# Patient Record
Sex: Male | Born: 1945
Health system: Southern US, Community
[De-identification: ages and names within clinical notes are randomized; demographics above are authoritative.]

## PROBLEM LIST (undated history)

## (undated) DIAGNOSIS — Z973 Presence of spectacles and contact lenses: Secondary | ICD-10-CM

## (undated) DIAGNOSIS — Z972 Presence of dental prosthetic device (complete) (partial): Secondary | ICD-10-CM

## (undated) DIAGNOSIS — G4730 Sleep apnea, unspecified: Secondary | ICD-10-CM

## (undated) DIAGNOSIS — I4891 Unspecified atrial fibrillation: Secondary | ICD-10-CM

## (undated) DIAGNOSIS — I10 Essential (primary) hypertension: Secondary | ICD-10-CM

## (undated) DIAGNOSIS — J189 Pneumonia, unspecified organism: Secondary | ICD-10-CM

## (undated) HISTORY — PX: COLONOSCOPY: SHX174

## (undated) HISTORY — DX: Sleep apnea, unspecified: G47.30

## (undated) HISTORY — DX: Essential (primary) hypertension: I10

## (undated) HISTORY — PX: KNEE SURGERY: SHX244

## (undated) HISTORY — PX: OTHER SURGICAL HISTORY: SHX169

## (undated) HISTORY — PX: FOOT SURGERY: SHX648

## (undated) HISTORY — DX: Unspecified atrial fibrillation: I48.91

## (undated) HISTORY — PX: HERNIA REPAIR: SHX51

---

## 2012-06-17 DIAGNOSIS — G4733 Obstructive sleep apnea (adult) (pediatric): Secondary | ICD-10-CM | POA: Insufficient documentation

## 2014-04-16 DIAGNOSIS — G4730 Sleep apnea, unspecified: Secondary | ICD-10-CM

## 2014-04-16 DIAGNOSIS — I10 Essential (primary) hypertension: Secondary | ICD-10-CM

## 2014-04-16 DIAGNOSIS — I4891 Unspecified atrial fibrillation: Secondary | ICD-10-CM

## 2014-04-16 HISTORY — DX: Sleep apnea, unspecified: G47.30

## 2014-04-16 HISTORY — DX: Essential (primary) hypertension: I10

## 2014-04-16 HISTORY — DX: Unspecified atrial fibrillation: I48.91

## 2014-08-13 DIAGNOSIS — R21 Rash and other nonspecific skin eruption: Secondary | ICD-10-CM | POA: Insufficient documentation

## 2014-08-14 DIAGNOSIS — E785 Hyperlipidemia, unspecified: Secondary | ICD-10-CM | POA: Insufficient documentation

## 2016-01-24 DIAGNOSIS — I10 Essential (primary) hypertension: Secondary | ICD-10-CM | POA: Insufficient documentation

## 2017-04-10 ENCOUNTER — Encounter: Payer: Self-pay | Admitting: *Deleted

## 2017-04-12 ENCOUNTER — Encounter: Payer: Self-pay | Admitting: Nurse Practitioner

## 2017-04-12 ENCOUNTER — Ambulatory Visit: Payer: Medicare PPO | Admitting: Nurse Practitioner

## 2017-04-12 VITALS — BP 118/86 | HR 106 | Temp 97.9°F | Ht 67.0 in | Wt 253.0 lb

## 2017-04-12 DIAGNOSIS — R5383 Other fatigue: Secondary | ICD-10-CM

## 2017-04-12 DIAGNOSIS — Z8669 Personal history of other diseases of the nervous system and sense organs: Secondary | ICD-10-CM | POA: Diagnosis not present

## 2017-04-12 DIAGNOSIS — I4891 Unspecified atrial fibrillation: Secondary | ICD-10-CM | POA: Diagnosis not present

## 2017-04-12 DIAGNOSIS — Z6841 Body Mass Index (BMI) 40.0 and over, adult: Secondary | ICD-10-CM

## 2017-04-12 MED ORDER — ASPIRIN EC 325 MG PO TBEC: 325.0000 mg | DELAYED_RELEASE_TABLET | Freq: Every day | ORAL | 0 refills | Status: DC

## 2017-04-12 NOTE — Patient Instructions (Addendum)
Atrial Fibrillation Atrial fibrillation is a type of irregular or rapid heartbeat (arrhythmia). In atrial fibrillation, the heart quivers continuously in a chaotic pattern. This occurs when parts of the heart receive disorganized signals that make the heart unable to pump blood normally. This can increase the risk for stroke, heart failure, and other heart-related conditions. There are different types of atrial fibrillation, including:  Paroxysmal atrial fibrillation. This type starts suddenly, and it usually stops on its own shortly after it starts.  Persistent atrial fibrillation. This type often lasts longer than a week. It may stop on its own or with treatment.  Long-lasting persistent atrial fibrillation. This type lasts longer than 12 months.  Permanent atrial fibrillation. This type does not go away.  Talk with your health care provider to learn about the type of atrial fibrillation that you have. What are the causes? This condition is caused by some heart-related conditions or procedures, including:  A heart attack.  Coronary artery disease.  Heart failure.  Heart valve conditions.  High blood pressure.  Inflammation of the sac that surrounds the heart (pericarditis).  Heart surgery.  Certain heart rhythm disorders, such as Wolf-Parkinson-White syndrome.  Other causes include:  Pneumonia.  Obstructive sleep apnea.  Blockage of an artery in the lungs (pulmonary embolism, or PE).  Lung cancer.  Chronic lung disease.  Thyroid problems, especially if the thyroid is overactive (hyperthyroidism).  Caffeine.  Excessive alcohol use or illegal drug use.  Use of some medicines, including certain decongestants and diet pills.  Sometimes, the cause cannot be found. What increases the risk? This condition is more likely to develop in:  People who are older in age.  People who smoke.  People who have diabetes mellitus.  People who are overweight  (obese).  Athletes who exercise vigorously.  What are the signs or symptoms? Symptoms of this condition include:  A feeling that your heart is beating rapidly or irregularly.  A feeling of discomfort or pain in your chest.  Shortness of breath.  Sudden light-headedness or weakness.  Getting tired easily during exercise.  In some cases, there are no symptoms. How is this diagnosed? Your health care provider may be able to detect atrial fibrillation when taking your pulse. If detected, this condition may be diagnosed with:  An electrocardiogram (ECG).  A Holter monitor test that records your heartbeat patterns over a 24-hour period.  Transthoracic echocardiogram (TTE) to evaluate how blood flows through your heart.  Transesophageal echocardiogram (TEE) to view more detailed images of your heart.  A stress test.  Imaging tests, such as a CT scan or chest X-ray.  Blood tests.  How is this treated? The main goals of treatment are to prevent blood clots from forming and to keep your heart beating at a normal rate and rhythm. The type of treatment that you receive depends on many factors, such as your underlying medical conditions and how you feel when you are experiencing atrial fibrillation. This condition may be treated with:  Medicine to slow down the heart rate, bring the heart's rhythm back to normal, or prevent clots from forming.  Electrical cardioversion. This is a procedure that resets your heart's rhythm by delivering a controlled, low-energy shock to the heart through your skin.  Different types of ablation, such as catheter ablation, catheter ablation with pacemaker, or surgical ablation. These procedures destroy the heart tissues that send abnormal signals. When the pacemaker is used, it is placed under your skin to help your heart beat in   a regular rhythm.  Follow these instructions at home:  Take over-the counter and prescription medicines only as told by your  health care provider.  If your health care provider prescribed a blood-thinning medicine (anticoagulant), take it exactly as told. Taking too much blood-thinning medicine can cause bleeding. If you do not take enough blood-thinning medicine, you will not have the protection that you need against stroke and other problems.  Do not use tobacco products, including cigarettes, chewing tobacco, and e-cigarettes. If you need help quitting, ask your health care provider.  If you have obstructive sleep apnea, manage your condition as told by your health care provider.  Do not drink alcohol.  Do not drink beverages that contain caffeine, such as coffee, soda, and tea.  Maintain a healthy weight. Do not use diet pills unless your health care provider approves. Diet pills may make heart problems worse.  Follow diet instructions as told by your health care provider.  Exercise regularly as told by your health care provider.  Keep all follow-up visits as told by your health care provider. This is important. How is this prevented?  Avoid drinking beverages that contain caffeine or alcohol.  Avoid certain medicines, especially medicines that are used for breathing problems.  Avoid certain herbs and herbal medicines, such as those that contain ephedra or ginseng.  Do not use illegal drugs, such as cocaine and amphetamines.  Do not smoke.  Manage your high blood pressure. Contact a health care provider if:  You notice a change in the rate, rhythm, or strength of your heartbeat.  You are taking an anticoagulant and you notice increased bruising.  You tire more easily when you exercise or exert yourself. Get help right away if:  You have chest pain, abdominal pain, sweating, or weakness.  You feel nauseous.  You notice blood in your vomit, bowel movement, or urine.  You have shortness of breath.  You suddenly have swollen feet and ankles.  You feel dizzy.  You have sudden weakness or  numbness of the face, arm, or leg, especially on one side of the body.  You have trouble speaking, trouble understanding, or both (aphasia).  Your face or your eyelid droops on one side. These symptoms may represent a serious problem that is an emergency. Do not wait to see if the symptoms will go away. Get medical help right away. Call your local emergency services (911 in the [place] Do not drive yourself to the hospital. This information is not intended to replace advice given to you by your health care provider. Make sure you discuss any questions you have with your health care provider. Document Released: 04/02/2005 Document Revised: 08/10/2015 Document Reviewed: 07/28/2014 Elsevier Interactive Patient Education  2018 Elsevier Inc.  

## 2017-04-12 NOTE — Progress Notes (Signed)
Subjective:  Patient ID: Ricky Dorsey, male    DOB: 1946/04/08  Age: 71 y.o. MRN: [id number]  CC: Establish Care (est care/ weight loss problem and vision problem)   HPI  No pcp in last 17yrs.  Retired. Moved to [place] from [place]. 36yrs ago.  Exercise 2-3times a week: treadmil and weights.  Diet:mostly vegetable, home cooked meals, fresh, organic.  Reports Hx of Sleep apnea:  Non restorative sleep: Interrupted every 40min, unable to sleep for more than 2hrs. Diagnosed with severe sleep apnea several years ago:  Has not used CPAP machine in 87yrs. Unable to tolerate different masks Sleeps in recliner. He is concerned about persistent weight gain and fatigue despite regular exercise and heart healthy diet.  Outpatient Medications Prior to Visit  Medication Sig Dispense Refill  . Multiple Vitamins-Minerals (ONE DAILY MENS 50+ MULTIVIT PO)      No facility-administered medications prior to visit.    Social History   Socioeconomic History  . Marital status: Married    Spouse name: None  . Number of children: None  . Years of education: None  . Highest education level: None  Social Needs  . Financial resource strain: None  . Food insecurity - worry: None  . Food insecurity - inability: None  . Transportation needs - medical: None  . Transportation needs - non-medical: None  Occupational History  . None  Tobacco Use  . Smoking status: Former Research scientist (life sciences)  . Smokeless tobacco: Never Used  . Tobacco comment: stop smoke 30years  Substance and Sexual Activity  . Alcohol use: No    Frequency: Never  . Drug use: No  . Sexual activity: None  Other Topics Concern  . None  Social History Narrative  . None   Family History  Problem Relation Age of Onset  . Diabetes Mother   . Heart disease Father   . Heart disease Brother   . Cancer Brother        lung cancer   ROS Review of Systems  Constitutional: Positive for malaise/fatigue. Negative for weight loss.  HENT: Positive for  congestion and hearing loss.   Respiratory: Negative.   Cardiovascular: Negative for chest pain, palpitations, orthopnea and leg swelling.  Gastrointestinal: Negative.   Genitourinary: Negative.   Musculoskeletal: Negative.   Skin: Negative.   Neurological: Negative.   Psychiatric/Behavioral: Negative.     Objective:  BP 118/86 (BP Location: Left Arm, Patient Position: Sitting, Cuff Size: Large)   Pulse (!) 106   Temp 97.9 F (36.6 C) (Oral)   Ht 5\' 7"  (1.702 m)   Wt 253 lb (114.8 kg)   SpO2 96%   BMI 39.63 kg/m   BP Readings from Last 3 Encounters:  04/12/17 118/86    Wt Readings from Last 3 Encounters:  04/12/17 253 lb (114.8 kg)   Physical Exam  Constitutional: He is oriented to person, place, and time. No distress.  Neck: Normal range of motion. Neck supple. No thyromegaly present.  Cardiovascular: Normal rate, normal heart sounds and intact distal pulses. An irregularly irregular rhythm present.  No murmur heard. Pulmonary/Chest: Effort normal and breath sounds normal.  Lymphadenopathy:    He has no cervical adenopathy.  Neurological: He is alert and oriented to person, place, and time.  Skin: Skin is warm and dry.  Psychiatric: He has a normal mood and affect. His behavior is normal.  Vitals reviewed.   Lab Results  Component Value Date   WBC 7.8 04/12/2017   HGB 17.6 (H) 04/12/2017  HCT 50.0 04/12/2017   PLT 271 04/12/2017   GLUCOSE 90 04/12/2017   CHOL 177 04/12/2017   TRIG 125 04/12/2017   HDL 36 (L) 04/12/2017   ALT 22 04/12/2017   AST 20 04/12/2017   NA 142 04/12/2017   K 5.1 04/12/2017   CL 103 04/12/2017   CREATININE 0.84 04/12/2017   BUN 17 04/12/2017   CO2 28 04/12/2017   TSH 2.32 04/12/2017   HGBA1C 5.3 04/12/2017   ECG indicates Atrial fibrillation, RBBB, and possible LVH  Assessment & Plan:   Rochelle was seen today for establish care.  Diagnoses and all orders for this visit:  Atrial fibrillation, unspecified type ([place]) -      EKG 12-Lead -     Ambulatory referral to Cardiology -     ECHOCARDIOGRAM COMPLETE; Future -     aspirin EC 325 MG tablet; Take 1 tablet (325 mg total) by mouth daily. -     Magnesium  Class 3 severe obesity with body mass index (BMI) of 40.0 to 44.9 in adult, unspecified obesity type, unspecified whether serious comorbidity present (HCC) -     TSH -     Comprehensive metabolic panel -     CBC w/Diff -     Hemoglobin A1c -     Lipid panel -     VITAMIN D 25 Hydroxy (Vit-D Deficiency, Fractures) -     Testosterone  Fatigue, unspecified type -     TSH -     Comprehensive metabolic panel -     CBC w/Diff -     Hemoglobin A1c -     Lipid panel -     VITAMIN D 25 Hydroxy (Vit-D Deficiency, Fractures) -     Testosterone  Hx of sleep apnea   I am having Kasten Caples start on aspirin EC. I am also having him maintain his Multiple Vitamins-Minerals (ONE DAILY MENS 50+ MULTIVIT PO).  Meds ordered this encounter  Medications  . aspirin EC 325 MG tablet    Sig: Take 1 tablet (325 mg total) by mouth daily.    Dispense:  30 tablet    Refill:  0    Order Specific Question:   Supervising Provider    Answer:   Lucille Passy [[id number]]    Follow-up: Return in about 4 weeks (around 05/10/2017) for fatigue and a-fib.  Wilfred Lacy, NP

## 2017-04-13 LAB — COMPREHENSIVE METABOLIC PANEL
AG Ratio: 1.6 (calc) (ref 1.0–2.5)
ALT: 22 U/L (ref 9–46)
AST: 20 U/L (ref 10–35)
Albumin: 4.1 g/dL (ref 3.6–5.1)
Alkaline phosphatase (APISO): 58 U/L (ref 40–115)
BILIRUBIN TOTAL: 0.6 mg/dL (ref 0.2–1.2)
BUN: 17 mg/dL (ref 7–25)
CALCIUM: 9.7 mg/dL (ref 8.6–10.3)
CO2: 28 mmol/L (ref 20–32)
CREATININE: 0.84 mg/dL (ref 0.70–1.25)
Chloride: 103 mmol/L (ref 98–110)
Globulin: 2.5 g/dL (calc) (ref 1.9–3.7)
Glucose, Bld: 90 mg/dL (ref 65–99)
Potassium: 5.1 mmol/L (ref 3.5–5.3)
SODIUM: 142 mmol/L (ref 135–146)
TOTAL PROTEIN: 6.6 g/dL (ref 6.1–8.1)

## 2017-04-13 LAB — CBC WITH DIFFERENTIAL/PLATELET
BASOS ABS: 62 {cells}/uL (ref 0–200)
Basophils Relative: 0.8 %
EOS PCT: 3.6 %
Eosinophils Absolute: 281 cells/uL (ref 15–500)
HEMATOCRIT: 50 % (ref 38.5–50.0)
HEMOGLOBIN: 17.6 g/dL — AB (ref 13.2–17.1)
LYMPHS ABS: 1700 {cells}/uL (ref 850–3900)
MCH: 32 pg (ref 27.0–33.0)
MCHC: 35.2 g/dL (ref 32.0–36.0)
MCV: 90.9 fL (ref 80.0–100.0)
MPV: 9.8 fL (ref 7.5–12.5)
Monocytes Relative: 10.5 %
NEUTROS ABS: 4937 {cells}/uL (ref 1500–7800)
Neutrophils Relative %: 63.3 %
Platelets: 271 10*3/uL (ref 140–400)
RBC: 5.5 10*6/uL (ref 4.20–5.80)
RDW: 12.7 % (ref 11.0–15.0)
Total Lymphocyte: 21.8 %
WBC: 7.8 10*3/uL (ref 3.8–10.8)
WBCMIX: 819 {cells}/uL (ref 200–950)

## 2017-04-13 LAB — HEMOGLOBIN A1C
EAG (MMOL/L): 5.8 (calc)
Hgb A1c MFr Bld: 5.3 % of total Hgb (ref <5.7)
Mean Plasma Glucose: 105 (calc)

## 2017-04-13 LAB — LIPID PANEL
CHOL/HDL RATIO: 4.9 (calc) (ref <5.0)
Cholesterol: 177 mg/dL (ref <200)
HDL: 36 mg/dL — AB (ref >40)
LDL CHOLESTEROL (CALC): 117 mg/dL — AB
NON-HDL CHOLESTEROL (CALC): 141 mg/dL — AB (ref <130)
TRIGLYCERIDES: 125 mg/dL (ref <150)

## 2017-04-13 LAB — MAGNESIUM: Magnesium: 2.2 mg/dL (ref 1.5–2.5)

## 2017-04-13 LAB — TESTOSTERONE: Testosterone: 344 ng/dL (ref 250–827)

## 2017-04-13 LAB — VITAMIN D 25 HYDROXY (VIT D DEFICIENCY, FRACTURES): Vit D, 25-Hydroxy: 30 ng/mL (ref 30–100)

## 2017-04-13 LAB — TSH: TSH: 2.32 m[IU]/L (ref 0.40–4.50)

## 2017-04-15 ENCOUNTER — Encounter: Payer: Self-pay | Admitting: Nurse Practitioner

## 2017-04-15 DIAGNOSIS — I4891 Unspecified atrial fibrillation: Secondary | ICD-10-CM | POA: Insufficient documentation

## 2017-04-15 DIAGNOSIS — Z8669 Personal history of other diseases of the nervous system and sense organs: Secondary | ICD-10-CM | POA: Insufficient documentation

## 2017-04-15 DIAGNOSIS — Z6841 Body Mass Index (BMI) 40.0 and over, adult: Secondary | ICD-10-CM

## 2017-04-15 NOTE — Assessment & Plan Note (Signed)
Undetermined time of onset. CHADs2 score of 0. Start ASA. Echocardiogram ordered Referral to cardiology entered.

## 2017-04-15 NOTE — Assessment & Plan Note (Signed)
No CPAP use at this time.

## 2017-04-19 ENCOUNTER — Other Ambulatory Visit: Payer: Self-pay

## 2017-04-19 ENCOUNTER — Ambulatory Visit (HOSPITAL_COMMUNITY): Payer: Medicare PPO | Attending: Cardiovascular Disease

## 2017-04-19 DIAGNOSIS — I420 Dilated cardiomyopathy: Secondary | ICD-10-CM | POA: Insufficient documentation

## 2017-04-19 DIAGNOSIS — I4891 Unspecified atrial fibrillation: Secondary | ICD-10-CM

## 2017-04-19 NOTE — Progress Notes (Unsigned)
Patient did not give consent to the use of Definity at this time. He stated that he will come back if additional images are needed.

## 2017-04-22 ENCOUNTER — Telehealth: Payer: Self-pay | Admitting: Nurse Practitioner

## 2017-04-22 NOTE — Telephone Encounter (Signed)
Copied from [place]. Topic: General - Other >> Apr 22, 2017  3:22 PM Lolita Rieger, [place] wrote: Reason for CRM: pt would like a call back concerning the dosage of his aspirin

## 2017-04-22 NOTE — Telephone Encounter (Signed)
Ricky Dorsey spoke with the pt, he verbalized understand--advise pt Okto take Aspirin 81 mg until he see the cardiologist.

## 2017-05-06 ENCOUNTER — Telehealth: Payer: Self-pay | Admitting: Nurse Practitioner

## 2017-05-06 NOTE — Telephone Encounter (Signed)
Spoke with Mr. Administrator, arts regarding AWV. Pt declined to schedule appt.   Pt stated that he has several other appointments coming up and does not want to add another one at this time. SF

## 2017-05-16 ENCOUNTER — Encounter: Payer: Self-pay | Admitting: Internal Medicine

## 2017-05-17 ENCOUNTER — Ambulatory Visit: Payer: Medicare PPO | Admitting: Nurse Practitioner

## 2017-05-17 ENCOUNTER — Encounter: Payer: Medicare PPO | Admitting: Nurse Practitioner

## 2017-05-24 ENCOUNTER — Ambulatory Visit: Payer: Medicare PPO | Admitting: Internal Medicine

## 2017-06-06 ENCOUNTER — Encounter: Payer: Self-pay | Admitting: Internal Medicine

## 2017-09-17 ENCOUNTER — Emergency Department (HOSPITAL_COMMUNITY)
Admission: EM | Admit: 2017-09-17 | Discharge: 2017-09-17 | Disposition: A | Discharge status: Home / Self Care | Payer: Medicare PPO | Attending: Emergency Medicine | Admitting: Emergency Medicine

## 2017-09-17 ENCOUNTER — Emergency Department (HOSPITAL_COMMUNITY): Payer: Medicare PPO

## 2017-09-17 DIAGNOSIS — Z7982 Long term (current) use of aspirin: Secondary | ICD-10-CM | POA: Diagnosis not present

## 2017-09-17 DIAGNOSIS — L03115 Cellulitis of right lower limb: Secondary | ICD-10-CM | POA: Insufficient documentation

## 2017-09-17 DIAGNOSIS — R509 Fever, unspecified: Secondary | ICD-10-CM | POA: Insufficient documentation

## 2017-09-17 DIAGNOSIS — I480 Paroxysmal atrial fibrillation: Secondary | ICD-10-CM | POA: Diagnosis not present

## 2017-09-17 DIAGNOSIS — Z87891 Personal history of nicotine dependence: Secondary | ICD-10-CM | POA: Insufficient documentation

## 2017-09-17 DIAGNOSIS — Z79899 Other long term (current) drug therapy: Secondary | ICD-10-CM | POA: Insufficient documentation

## 2017-09-17 DIAGNOSIS — R531 Weakness: Secondary | ICD-10-CM | POA: Diagnosis present

## 2017-09-17 DIAGNOSIS — I10 Essential (primary) hypertension: Secondary | ICD-10-CM | POA: Insufficient documentation

## 2017-09-17 DIAGNOSIS — I4891 Unspecified atrial fibrillation: Secondary | ICD-10-CM

## 2017-09-17 LAB — BASIC METABOLIC PANEL
Anion gap: 9 (ref 5–15)
BUN: 16 mg/dL (ref 6–20)
CALCIUM: 8.7 mg/dL — AB (ref 8.9–10.3)
CHLORIDE: 102 mmol/L (ref 101–111)
CO2: 22 mmol/L (ref 22–32)
Creatinine, Ser: 0.87 mg/dL (ref 0.61–1.24)
GFR calc non Af Amer: >60 mL/min (ref >60)
Glucose, Bld: 116 mg/dL — ABNORMAL HIGH (ref 65–99)
Potassium: 4.4 mmol/L (ref 3.5–5.1)
SODIUM: 133 mmol/L — AB (ref 135–145)

## 2017-09-17 LAB — CBC
HCT: 46.3 % (ref 39.0–52.0)
Hemoglobin: 15.9 g/dL (ref 13.0–17.0)
MCH: 31.5 pg (ref 26.0–34.0)
MCHC: 34.3 g/dL (ref 30.0–36.0)
MCV: 91.7 fL (ref 78.0–100.0)
PLATELETS: 178 10*3/uL (ref 150–400)
RBC: 5.05 MIL/uL (ref 4.22–5.81)
RDW: 13.4 % (ref 11.5–15.5)
WBC: 7.7 10*3/uL (ref 4.0–10.5)

## 2017-09-17 LAB — I-STAT CG4 LACTIC ACID, ED: LACTIC ACID, VENOUS: 1.27 mmol/L (ref 0.5–1.9)

## 2017-09-17 LAB — I-STAT TROPONIN, ED: Troponin i, poc: 0.02 ng/mL (ref 0.00–0.08)

## 2017-09-17 MED ORDER — DOXYCYCLINE HYCLATE 100 MG PO TABS
100.0000 mg | ORAL_TABLET | Freq: Once | ORAL | Status: AC
  Administered 2017-09-17: 100 mg via ORAL
  Filled 2017-09-17: qty 1

## 2017-09-17 MED ORDER — SODIUM CHLORIDE 0.9 % IV BOLUS
1000.0000 mL | Freq: Once | INTRAVENOUS | Status: AC
  Administered 2017-09-17: 1000 mL via INTRAVENOUS

## 2017-09-17 MED ORDER — DOXYCYCLINE HYCLATE 100 MG PO CAPS: 100.0000 mg | ORAL_CAPSULE | Freq: Two times a day (BID) | ORAL | 0 refills | Status: DC

## 2017-09-17 MED ORDER — ACETAMINOPHEN 325 MG PO TABS
650.0000 mg | ORAL_TABLET | Freq: Once | ORAL | Status: AC
  Administered 2017-09-17: 650 mg via ORAL
  Filled 2017-09-17: qty 2

## 2017-09-17 MED ORDER — DILTIAZEM HCL 30 MG PO TABS
30.0000 mg | ORAL_TABLET | Freq: Once | ORAL | Status: AC
  Administered 2017-09-17: 30 mg via ORAL
  Filled 2017-09-17: qty 1

## 2017-09-17 NOTE — ED Notes (Signed)
Patient transported to X-ray 

## 2017-09-17 NOTE — ED Triage Notes (Signed)
Pt states after working outside yesterday he has felt weak with fatigue and having a headache. Pt was sent here from urgent care due to afib pt has paroxsymal afib. Current rate 115's.

## 2017-09-17 NOTE — Discharge Instructions (Addendum)
Continue Aspirin Take Doxycycline twice daily for the next 5 days Please follow up with A. Fib clinic. They should give you a call but if you don't hear from them, their number [phone]

## 2017-09-17 NOTE — ED Provider Notes (Signed)
[facility] EMERGENCY DEPARTMENT Provider Note   CSN: [id number] Arrival date & time: 09/17/17  1303     History   Chief Complaint Chief Complaint  Patient presents with  . Weakness    HPI Ricky Dorsey is a 72 y.o. male who presents with weakness. PMH significant for hx of A.fib, sleep apnea, HTN. He states that yesterday he was working outside for ~4 hours. When he came inside he felt weak, tired, had a headache and was thirsty so he rested and drank fluids. Early in the morning he felt like his stomach was cramping so he forced himself to throw up which made him feel a little better for a couple hours. Then he started to feel weak and unwell again. He decided to go to UC who did an EKG and it showed he was in A.fib with RVR. He is from [facility] and his A.fib was previously attributed to his OSA. He has been off any anticoagulation or treatment for his A.fib since 2011. He denies prior cardiac history. He denies URI symptoms, cough, congestion, chest pain, SOB, abdominal pain, N/V/D, urinary symptoms. He has had a recent surgery on one of the tendons on his left 2nd toe about three weeks ago by podiatry. It has been red and a little painful as well. Echo in Jan 2019 showed EF 60-65%  HPI  Past Medical History:  Diagnosis Date  . Atrial fibrillation ([place]) 2016  . Hypertension 2016  . Sleep apnea 2016    Patient Active Problem List   Diagnosis Date Noted  . Atrial fibrillation ([place]) 04/15/2017  . Class 3 severe obesity with body mass index (BMI) of 40.0 to 44.9 in adult [facility]) 04/15/2017  . Hx of sleep apnea 04/15/2017    Past Surgical History:  Procedure Laterality Date  . KNEE SURGERY Bilateral   . toe removal Right    tip of middle toe      Home Medications    Prior to Admission medications   Medication Sig Start Date End Date Taking? Authorizing Provider  aspirin EC 325 MG tablet Take 1 tablet (325 mg total) by mouth daily. 04/12/17  Yes Nche, Charlene Brooke, NP  ibuprofen (ADVIL,MOTRIN) 200 MG tablet Take 800 mg by mouth every 6 (six) hours as needed (for pain or headaches).   Yes [provider]  Magnesium 500 MG TABS Take 500 mg by mouth daily.   Yes [provider]  Multiple Vitamins-Minerals (ONE DAILY MENS 50+ MULTIVIT PO) Take 1 tablet by mouth daily.  01/14/17  Yes [provider]  NON FORMULARY Apply 1 application topically See admin instructions. "Soothing Blend" Essential Oil(s): Apply topically as needed to provide comfort   Yes [provider]    Family History Family History  Problem Relation Age of Onset  . Diabetes Mother   . Heart disease Father   . Heart disease Brother   . Cancer Brother        lung cancer    Social History Social History   Tobacco Use  . Smoking status: Former Research scientist (life sciences)  . Smokeless tobacco: Never Used  . Tobacco comment: stop smoke 30years  Substance Use Topics  . Alcohol use: No    Frequency: Never  . Drug use: No     Allergies   Patient has no known allergies.   Review of Systems Review of Systems  Constitutional: Positive for activity change, fatigue and fever.  HENT: Negative for congestion and sore throat.  Respiratory: Negative for cough and shortness of breath.   Cardiovascular: Negative for chest pain, palpitations and leg swelling.  Gastrointestinal: Negative for abdominal pain, diarrhea, nausea and vomiting.  Genitourinary: Negative for dysuria and frequency.  Musculoskeletal: Positive for arthralgias.  Skin: Positive for color change.  Neurological: Positive for weakness.  All other systems reviewed and are negative.    Physical Exam Updated Vital Signs BP 104/71   Pulse (!) 108   Temp (!) 101.2 F (38.4 C) (Oral)   Resp 18   SpO2 96%   Physical Exam  Constitutional: He is oriented to person, place, and time. He appears well-developed and well-nourished. No distress.  HENT:  Head: Normocephalic and atraumatic.  Right Ear:  Tympanic membrane normal.  Left Ear: Tympanic membrane normal.  Nose: Nose normal.  Mouth/Throat: Uvula is midline, oropharynx is clear and moist and mucous membranes are normal.  Eyes: Pupils are equal, round, and reactive to light. Conjunctivae are normal. Right eye exhibits no discharge. Left eye exhibits no discharge. No scleral icterus.  Neck: Normal range of motion.  Cardiovascular: An irregularly irregular rhythm present. Tachycardia present.  Pulmonary/Chest: Breath sounds normal. Tachypnea (mildly) noted. No respiratory distress.  Abdominal: Soft. Bowel sounds are normal. He exhibits no distension. There is no tenderness.  Musculoskeletal:  Erythema noted over the left foot from the toes to mid-foot. Healing blister over the tip of the great toe. Callus formation over the plantar aspect of the 2nd toe.  Neurological: He is alert and oriented to person, place, and time.  Skin: Skin is warm and dry.  Psychiatric: He has a normal mood and affect. His behavior is normal.  Nursing note and vitals reviewed.    ED Treatments / Results  Labs (all labs ordered are listed, but only abnormal results are displayed) Labs Reviewed  BASIC METABOLIC PANEL - Abnormal; Notable for the following components:      Result Value   Sodium 133 (*)    Glucose, Bld 116 (*)    Calcium 8.7 (*)    All other components within normal limits  CBC  I-STAT CG4 LACTIC ACID, ED  I-STAT TROPONIN, ED    EKG EKG Interpretation  Date/Time:  Tuesday September 17 2017 13:10:33 EDT Ventricular Rate:  121 PR Interval:    QRS Duration: 98 QT Interval:  314 QTC Calculation: 445 R Axis:   121 Text Interpretation:  Atrial fibrillation with rapid ventricular response Right axis deviation Incomplete right bundle branch block Possible Right ventricular hypertrophy Abnormal ECG No STEMI. No prior tracings for comparisons.  Confirmed by Nanda Quinton [phone]) on 09/17/2017 5:05:32 PM   Radiology Dg Chest 2 View  Result  Date: 09/17/2017 CLINICAL DATA:  Paroxysmal atrial fibrillation. EXAM: CHEST - 2 VIEW COMPARISON:  None. FINDINGS: Cardiomediastinal silhouette is mildly enlarged. Calcific atherosclerotic disease and tortuosity of the aorta. Mediastinal contours appear intact. There is no evidence of focal airspace consolidation, pleural effusion or pneumothorax. Osseous structures are without acute abnormality. Soft tissues are grossly normal. IMPRESSION: No active cardiopulmonary disease. Calcific atherosclerotic disease and tortuosity of the aorta. Mildly enlarged cardiac silhouette. Electronically Signed   By: Fidela Salisbury M.D.   On: 09/17/2017 18:16   Dg Foot Complete Right  Result Date: 09/17/2017 CLINICAL DATA:  Swelling redness and pain to the first and second right toes. EXAM: RIGHT FOOT COMPLETE - 3+ VIEW COMPARISON:  None. FINDINGS: There is no evidence of fracture or dislocation. Arthritic changes at the first metatarsophalangeal joint and first interphalangeal joint.  Soft tissues are unremarkable. IMPRESSION: No evidence of acute osseous abnormalities. Arthritic changes at the first metatarsophalangeal and interphalangeal joints. Electronically Signed   By: Fidela Salisbury M.D.   On: 09/17/2017 18:18    Procedures Procedures (including critical care time)  Medications Ordered in ED Medications  sodium chloride 0.9 % bolus 1,000 mL (has no administration in time range)  acetaminophen (TYLENOL) tablet 650 mg (650 mg Oral Given 09/17/17 1549)  diltiazem (CARDIZEM) tablet 30 mg (30 mg Oral Given 09/17/17 1703)  doxycycline (VIBRA-TABS) tablet 100 mg (100 mg Oral Given 09/17/17 1702)     Initial Impression / Assessment and Plan / ED Course  I have reviewed the triage vital signs and the nursing notes.  Pertinent labs & imaging results that were available during my care of the patient were reviewed by me and considered in my medical decision making (see chart for details).  72 year old male  presents with weakness, fever, and evidence of a foot infection on exam. He is also noted to be in A.fib with RVR. He is febrile to 101.2. He is tachycardic as high as 120 but is generally ranging from 100-110. His work up thus far is very reassuring. His CBC, BMP, lactic acid are normal. Troponin is normal. He will be given IVF, Tylenol, low dose of Diltiazem, and Doxy and reassessed. CXR and R foot xray were ordered as well.  On recheck he is feeling better. HR is normal although it is still irregular. CXR shows aortic atherosclerosis and mild cardiomegaly. R foot xray is remarkable for arthritis. Shared visit with Dr. Laverta Baltimore. We will continue Doxy and advised f/u with A.fib clinic.   Final Clinical Impressions(s) / ED Diagnoses   Final diagnoses:  Fever, unspecified fever cause  Cellulitis of right foot  Atrial fibrillation with RVR [facility])    ED Discharge Orders    None       Recardo Evangelist, PA-C 09/17/17 2010    Margette Fast, MD 09/18/17 [phone]

## 2017-09-17 NOTE — ED Notes (Signed)
Pt ambulated to bathroom while in xray and in the room, able to ambulate without assistance.

## 2017-10-01 ENCOUNTER — Ambulatory Visit (HOSPITAL_COMMUNITY)
Admission: RE | Admit: 2017-10-01 | Discharge: 2017-10-01 | Disposition: A | Discharge status: Home / Self Care | Payer: Medicare PPO | Source: Ambulatory Visit | Attending: Nurse Practitioner | Admitting: Nurse Practitioner

## 2017-10-01 ENCOUNTER — Encounter (HOSPITAL_COMMUNITY): Payer: Self-pay | Admitting: Nurse Practitioner

## 2017-10-01 VITALS — BP 136/82 | HR 90 | Ht 67.0 in | Wt 250.0 lb

## 2017-10-01 DIAGNOSIS — I482 Chronic atrial fibrillation: Secondary | ICD-10-CM | POA: Diagnosis present

## 2017-10-01 DIAGNOSIS — G4730 Sleep apnea, unspecified: Secondary | ICD-10-CM | POA: Diagnosis not present

## 2017-10-01 DIAGNOSIS — Z87891 Personal history of nicotine dependence: Secondary | ICD-10-CM | POA: Insufficient documentation

## 2017-10-01 DIAGNOSIS — I4821 Permanent atrial fibrillation: Secondary | ICD-10-CM

## 2017-10-01 DIAGNOSIS — Z6839 Body mass index (BMI) 39.0-39.9, adult: Secondary | ICD-10-CM | POA: Insufficient documentation

## 2017-10-01 DIAGNOSIS — Z7982 Long term (current) use of aspirin: Secondary | ICD-10-CM | POA: Insufficient documentation

## 2017-10-01 DIAGNOSIS — Z801 Family history of malignant neoplasm of trachea, bronchus and lung: Secondary | ICD-10-CM | POA: Diagnosis not present

## 2017-10-01 DIAGNOSIS — I10 Essential (primary) hypertension: Secondary | ICD-10-CM | POA: Insufficient documentation

## 2017-10-01 NOTE — Progress Notes (Signed)
Primary Care Physician: Lorayne Marek Charlene Brooke, NP Referring Physician: ER   Ricky Dorsey is a 72 y.o. male with a history of permanent atrial fibrillation who presents for consultation in the [facility].  The patient was initially diagnosed with atrial fibrillation in 2011 while living in [place]. His AF was found incidentally at that time and he was without symptoms. He was placed on Xarelto with cardioversion in 2014 which did not maintain SR. Because of low CHADS2VASC score, OAC was discontinued.  He recently moved to [facility] and has been referred to the AF clinic to evaluate treatment options. He has struggled with weight despite regular exercise and good diet.  He is concerned about his inability to lose weight.   Today, he denies symptoms of palpitations, chest pain, shortness of breath, orthopnea, PND, lower extremity edema, dizziness, presyncope, syncope, snoring, daytime somnolence, bleeding, or neurologic sequela. The patient is tolerating medications without difficulties and is otherwise without complaint today.    Atrial Fibrillation Risk Factors:  he does have symptoms or diagnosis of sleep apnea. he is compliant with CPAP therapy.  he does not have a history of rheumatic fever.  he does not have a history of alcohol use.  he has a BMI of Body mass index is 39.16 kg/m.Marland Kitchen Filed Weights   10/01/17 0938  Weight: 250 lb (113.4 kg)    LA size: 46   Atrial Fibrillation Management history:  Previous antiarrhythmic drugs: none  Previous cardioversions: 2014  Previous ablations: none  CHADS2VASC score: 1  Anticoagulation history: Xarelto for a short period around DCCV   Past Medical History:  Diagnosis Date  . Atrial fibrillation ([place]) 2016  . Hypertension 2016  . Sleep apnea 2016   Past Surgical History:  Procedure Laterality Date  . KNEE SURGERY Bilateral   . toe removal Right    tip of middle toe    Current Outpatient Medications    Medication Sig Dispense Refill  . aspirin EC 325 MG tablet Take 1 tablet (325 mg total) by mouth daily. 30 tablet 0  . ibuprofen (ADVIL,MOTRIN) 200 MG tablet Take 800 mg by mouth every 6 (six) hours as needed (for pain or headaches).    . Magnesium 500 MG TABS Take 500 mg by mouth daily.    . Multiple Vitamins-Minerals (ONE DAILY MENS 50+ MULTIVIT PO) Take 1 tablet by mouth daily.     . NON FORMULARY Apply 1 application topically See admin instructions. "Soothing Blend" Essential Oil(s): Apply topically as needed to provide comfort     No current facility-administered medications for this encounter.     No Known Allergies  Social History   Socioeconomic History  . Marital status: Married    Spouse name: Not on file  . Number of children: Not on file  . Years of education: Not on file  . Highest education level: Not on file  Occupational History  . Not on file  Social Needs  . Financial resource strain: Not on file  . Food insecurity:    Worry: Not on file    Inability: Not on file  . Transportation needs:    Medical: Not on file    Non-medical: Not on file  Tobacco Use  . Smoking status: Former Research scientist (life sciences)  . Smokeless tobacco: Never Used  . Tobacco comment: stop smoke 30years  Substance and Sexual Activity  . Alcohol use: No    Frequency: Never  . Drug use: No  . Sexual activity: Not  on file  Lifestyle  . Physical activity:    Days per week: Not on file    Minutes per session: Not on file  . Stress: Not on file  Relationships  . Social connections:    Talks on phone: Not on file    Gets together: Not on file    Attends religious service: Not on file    Active member of club or organization: Not on file    Attends meetings of clubs or organizations: Not on file    Relationship status: Not on file  . Intimate partner violence:    Fear of current or ex partner: Not on file    Emotionally abused: Not on file    Physically abused: Not on file    Forced sexual  activity: Not on file  Other Topics Concern  . Not on file  Social History Narrative  . Not on file    Family History  Problem Relation Age of Onset  . Diabetes Mother   . Heart disease Father   . Heart disease Brother   . Cancer Brother        lung cancer   The patient does not have a history of early familial atrial fibrillation or other arrhythmias.  ROS- All systems are reviewed and negative except as per the HPI above.  Physical Exam: Vitals:   10/01/17 0938  BP: 136/82  Pulse: 90  Weight: 250 lb (113.4 kg)  Height: 5\' 7"  (1.702 m)    GEN- The patient is well appearing, alert and oriented x 3 today.   Head- normocephalic, atraumatic Eyes-  Sclera clear, conjunctiva pink Ears- hearing intact Oropharynx- clear Neck- supple  Lungs- Clear to ausculation bilaterally, normal work of breathing Heart- Regular rate and rhythm, no murmurs, rubs or gallops  GI- soft, NT, ND, + BS Extremities- no clubbing, cyanosis, or edema MS- no significant deformity or atrophy Skin- no rash or lesion Psych- euthymic mood, full affect Neuro- strength and sensation are intact  Wt Readings from Last 3 Encounters:  10/01/17 250 lb (113.4 kg)  04/12/17 253 lb (114.8 kg)    EKG today demonstrates atrial fibrillation, rate 90, iRBBB, QRS 155msec, QTc 494msec Echo 04/2017 demonstrated EF 60-65%, no RWMA, LA enlargement  Epic records are reviewed at length today  Assessment and Plan:  1. Permanent atrial fibrillation The patient has asymptomatic permanent atrial fibrillation.   CHADS2VASC is 1.  We discussed options today including no OAC, ASA, or OAC based on guidelines.  He will continue ASA 81mg  daily.  As he is asymptomatic and with duration of AF, I would not pursue rhythm control strategy.   2. Morbid obesity Body mass index is 39.16 kg/m. He is frustrated by his inability to lose weight. I have given him the number to Dennard Nip, MD to reach out to.   Follow up with AF  clinic as needed   Chanetta Marshall, NP 10/02/2017 9:28 PM

## 2017-10-01 NOTE — Patient Instructions (Signed)
Janeal Holmes weight management- [phone]

## 2018-02-18 DIAGNOSIS — M2041 Other hammer toe(s) (acquired), right foot: Secondary | ICD-10-CM | POA: Diagnosis not present

## 2018-02-21 DIAGNOSIS — M12271 Villonodular synovitis (pigmented), right ankle and foot: Secondary | ICD-10-CM | POA: Diagnosis not present

## 2018-03-05 DIAGNOSIS — M12271 Villonodular synovitis (pigmented), right ankle and foot: Secondary | ICD-10-CM | POA: Diagnosis not present

## 2018-03-19 DIAGNOSIS — Z79899 Other long term (current) drug therapy: Secondary | ICD-10-CM | POA: Diagnosis not present

## 2018-03-19 DIAGNOSIS — M2041 Other hammer toe(s) (acquired), right foot: Secondary | ICD-10-CM | POA: Diagnosis not present

## 2018-03-19 DIAGNOSIS — M12271 Villonodular synovitis (pigmented), right ankle and foot: Secondary | ICD-10-CM | POA: Diagnosis not present

## 2018-03-19 DIAGNOSIS — L03031 Cellulitis of right toe: Secondary | ICD-10-CM | POA: Diagnosis not present

## 2018-03-20 DIAGNOSIS — R609 Edema, unspecified: Secondary | ICD-10-CM | POA: Diagnosis not present

## 2018-03-26 DIAGNOSIS — M25774 Osteophyte, right foot: Secondary | ICD-10-CM | POA: Diagnosis not present

## 2018-03-26 DIAGNOSIS — L97522 Non-pressure chronic ulcer of other part of left foot with fat layer exposed: Secondary | ICD-10-CM | POA: Diagnosis not present

## 2018-04-03 DIAGNOSIS — L97511 Non-pressure chronic ulcer of other part of right foot limited to breakdown of skin: Secondary | ICD-10-CM | POA: Diagnosis not present

## 2018-04-29 DIAGNOSIS — J22 Unspecified acute lower respiratory infection: Secondary | ICD-10-CM | POA: Diagnosis not present

## 2018-05-01 DIAGNOSIS — J22 Unspecified acute lower respiratory infection: Secondary | ICD-10-CM | POA: Diagnosis not present

## 2018-05-06 DIAGNOSIS — J189 Pneumonia, unspecified organism: Secondary | ICD-10-CM | POA: Diagnosis not present

## 2018-05-06 DIAGNOSIS — I878 Other specified disorders of veins: Secondary | ICD-10-CM | POA: Diagnosis not present

## 2018-05-07 DIAGNOSIS — M2041 Other hammer toe(s) (acquired), right foot: Secondary | ICD-10-CM | POA: Diagnosis not present

## 2018-05-07 DIAGNOSIS — L97512 Non-pressure chronic ulcer of other part of right foot with fat layer exposed: Secondary | ICD-10-CM | POA: Diagnosis not present

## 2018-05-07 DIAGNOSIS — T8189XA Other complications of procedures, not elsewhere classified, initial encounter: Secondary | ICD-10-CM | POA: Diagnosis not present

## 2018-05-15 DIAGNOSIS — L97512 Non-pressure chronic ulcer of other part of right foot with fat layer exposed: Secondary | ICD-10-CM | POA: Diagnosis not present

## 2018-05-17 DIAGNOSIS — J189 Pneumonia, unspecified organism: Secondary | ICD-10-CM

## 2018-05-17 HISTORY — DX: Pneumonia, unspecified organism: J18.9

## 2018-06-05 DIAGNOSIS — M79671 Pain in right foot: Secondary | ICD-10-CM | POA: Diagnosis not present

## 2018-06-05 DIAGNOSIS — Z0001 Encounter for general adult medical examination with abnormal findings: Secondary | ICD-10-CM | POA: Diagnosis not present

## 2018-06-05 DIAGNOSIS — I878 Other specified disorders of veins: Secondary | ICD-10-CM | POA: Diagnosis not present

## 2018-06-11 ENCOUNTER — Ambulatory Visit (INDEPENDENT_AMBULATORY_CARE_PROVIDER_SITE_OTHER): Payer: Self-pay | Admitting: Orthopedic Surgery

## 2018-06-12 ENCOUNTER — Ambulatory Visit (INDEPENDENT_AMBULATORY_CARE_PROVIDER_SITE_OTHER): Payer: Medicare PPO | Admitting: Orthopedic Surgery

## 2018-06-12 ENCOUNTER — Ambulatory Visit (INDEPENDENT_AMBULATORY_CARE_PROVIDER_SITE_OTHER): Payer: Medicare PPO

## 2018-06-12 ENCOUNTER — Encounter (INDEPENDENT_AMBULATORY_CARE_PROVIDER_SITE_OTHER): Payer: Self-pay | Admitting: Orthopedic Surgery

## 2018-06-12 VITALS — Ht 67.0 in | Wt 250.0 lb

## 2018-06-12 DIAGNOSIS — I87321 Chronic venous hypertension (idiopathic) with inflammation of right lower extremity: Secondary | ICD-10-CM | POA: Diagnosis not present

## 2018-06-12 DIAGNOSIS — M6701 Short Achilles tendon (acquired), right ankle: Secondary | ICD-10-CM

## 2018-06-12 DIAGNOSIS — M79671 Pain in right foot: Secondary | ICD-10-CM

## 2018-06-16 ENCOUNTER — Encounter (INDEPENDENT_AMBULATORY_CARE_PROVIDER_SITE_OTHER): Payer: Self-pay | Admitting: Orthopedic Surgery

## 2018-06-16 ENCOUNTER — Telehealth (INDEPENDENT_AMBULATORY_CARE_PROVIDER_SITE_OTHER): Payer: Self-pay | Admitting: Orthopedic Surgery

## 2018-06-16 NOTE — Progress Notes (Signed)
Office Visit Note   Patient: Ricky Dorsey           Date of Birth: 1945-08-24           MRN: [id number] Visit Date: 06/12/2018              Requested by: Flossie Buffy, [address] [place], [place] [address] PCP: Flossie Buffy, NP  Chief Complaint  Patient presents with  . Right Foot - Pain      HPI: Patient is a 73 year old gentleman who was seen for initial evaluation for right forefoot pain.  Patient complains of redness and swelling of the second toe on fourth toe.  Patient states he has been followed by podiatry.  Patient denies a history of diabetes is not using blood thinners.  Assessment & Plan: Visit Diagnoses:  1. Right foot pain   2. Pain in right foot   3. Idiopathic chronic venous hypertension of right lower extremity with inflammation   4. Achilles tendon contracture, right     Plan: Patient does have Achilles contracture with venous insufficiency and is overloading his forefoot.  Recommended 15 to 20 mm compression stockings that patient will go to [facility] to get size extra-large.  Recommended Achilles stretching and this was demonstrated.  Recommended a stiff soled sneaker to unload the forefoot such as Hoka sneakers with over-the-counter orthotics to get more pressure beneath the arch.  Follow-Up Instructions: Return in about 2 weeks (around 06/26/2018).   Ortho Exam  Patient is alert, oriented, no adenopathy, well-dressed, normal affect, normal respiratory effort.  Conference 43 cm and brawny skin color changes.  He is status post nail excisions for toes 1 2 and 3.  He has fixed clawing of the lesser toes with a ulcer over the tip of the fourth toe from the claw toe deformity.  There is no exposed bone or tendon the ulcer is superficial. On examination patient has a strong dorsalis pedis pulse.  With his knee extended he only has dorsiflexion to neutral.  He has venous stasis insufficiency with a capsular  Imaging: No results  found. No images are attached to the encounter.  Labs: Lab Results  Component Value Date   HGBA1C 5.3 04/12/2017     No results found for: ALBUMIN, PREALBUMIN, LABURIC  Body mass index is 39.16 kg/m.  Orders:  Orders Placed This Encounter  Procedures  . XR Foot Complete Right   No orders of the defined types were placed in this encounter.    Procedures: No procedures performed  Clinical Data: No additional findings.  ROS:  All other systems negative, except as noted in the HPI. Review of Systems  Objective: Vital Signs: Ht 5\' 7"  (1.702 m)   Wt 250 lb (113.4 kg)   BMI 39.16 kg/m   Specialty Comments:  No specialty comments available.  PMFS History: Patient Active Problem List   Diagnosis Date Noted  . Atrial fibrillation ([place]) 04/15/2017  . Class 3 severe obesity with body mass index (BMI) of 40.0 to 44.9 in adult [facility]) 04/15/2017  . Hx of sleep apnea 04/15/2017   Past Medical History:  Diagnosis Date  . Atrial fibrillation ([place]) 2016  . Hypertension 2016  . Sleep apnea 2016    Family History  Problem Relation Age of Onset  . Diabetes Mother   . Heart disease Father   . Heart disease Brother   . Cancer Brother        lung cancer  Past Surgical History:  Procedure Laterality Date  . KNEE SURGERY Bilateral   . toe removal Right    tip of middle toe   Social History   Occupational History  . Not on file  Tobacco Use  . Smoking status: Former Research scientist (life sciences)  . Smokeless tobacco: Never Used  . Tobacco comment: stop smoke 30years  Substance and Sexual Activity  . Alcohol use: No    Frequency: Never  . Drug use: No  . Sexual activity: Not on file

## 2018-06-16 NOTE — Telephone Encounter (Signed)
Filled out rx for [facility] medical. Vive socks XL knee high 15-20 compression. Called pt to advise that this is ready for pick up at the front desk. He will come in tomorrow.

## 2018-06-16 NOTE — Telephone Encounter (Signed)
Noted  

## 2018-06-16 NOTE — Telephone Encounter (Signed)
Patient was given script for socks and misplaced it. Wants to pick up another one.  Please call patient to advise. [phone]

## 2018-06-25 ENCOUNTER — Ambulatory Visit (INDEPENDENT_AMBULATORY_CARE_PROVIDER_SITE_OTHER): Payer: Medicare PPO | Admitting: Orthopedic Surgery

## 2018-06-30 ENCOUNTER — Ambulatory Visit (INDEPENDENT_AMBULATORY_CARE_PROVIDER_SITE_OTHER): Payer: Medicare PPO | Admitting: Orthopedic Surgery

## 2018-07-10 ENCOUNTER — Other Ambulatory Visit: Payer: Self-pay

## 2018-07-10 ENCOUNTER — Ambulatory Visit (INDEPENDENT_AMBULATORY_CARE_PROVIDER_SITE_OTHER): Payer: Medicare PPO

## 2018-07-10 ENCOUNTER — Encounter (INDEPENDENT_AMBULATORY_CARE_PROVIDER_SITE_OTHER): Payer: Self-pay | Admitting: Orthopedic Surgery

## 2018-07-10 ENCOUNTER — Ambulatory Visit (INDEPENDENT_AMBULATORY_CARE_PROVIDER_SITE_OTHER): Payer: Medicare PPO | Admitting: Orthopedic Surgery

## 2018-07-10 VITALS — Ht 67.0 in | Wt 250.0 lb

## 2018-07-10 DIAGNOSIS — L03031 Cellulitis of right toe: Secondary | ICD-10-CM | POA: Diagnosis not present

## 2018-07-10 DIAGNOSIS — M86471 Chronic osteomyelitis with draining sinus, right ankle and foot: Secondary | ICD-10-CM | POA: Diagnosis not present

## 2018-07-10 DIAGNOSIS — M869 Osteomyelitis, unspecified: Secondary | ICD-10-CM | POA: Diagnosis not present

## 2018-07-10 MED ORDER — DOXYCYCLINE HYCLATE 100 MG PO TABS: 100.0000 mg | ORAL_TABLET | Freq: Two times a day (BID) | ORAL | 0 refills | Status: DC

## 2018-07-10 NOTE — Progress Notes (Signed)
Office Visit Note   Patient: Ricky Dorsey           Date of Birth: [birth year]           MRN: [id number] Visit Date: 07/10/2018              Requested by: Flossie Buffy, NP [address] [place], [place] [address] PCP: Flossie Buffy, NP  Chief Complaint  Patient presents with   Right Foot - Pain      HPI: Patient is a 73 year old gentleman who presents with acute cellulitis right foot fourth toe.  He has been wearing the Vive knee-high compression stockings he has been wearing Hoka sneakers with the sole orthotics.  Patient states the callus continues to recur on the fourth toe and he has had cellulitis 3 times.  Patient is not a smoker no history of diabetes no history of hypertension.  Assessment & Plan: Visit Diagnoses: No diagnosis found.  Plan: Discussed with the patient with the swelling cellulitis and osteomyelitis of the tuft of the fourth toe would recommend amputation of the fourth toe through the PIP joint.  And with the chronic osteomyelitis of the tuft of the second toe would recommend amputating the second toe through the middle phalanx.  Discussed that he would be out of work for about 2 weeks.  A prescription for doxycycline is called in today patient states he would like to talk with his with his wife he will call us and anticipate surgery Wednesday or Friday next week.  Follow-Up Instructions: No follow-ups on file.   Ortho Exam  Patient is alert, oriented, no adenopathy, well-dressed, normal affect, normal respiratory effort. Examination patient has a strong dorsalis pedis and posterior tibial pulse.  He does have venous stasis changes but no venous ulcers.  There is acute cellulitis of the right fourth toe with sausage digit swelling.  After informed consent a 10 blade knife was used to debride the skin and soft tissue from the ulcer of the tip of the toe this left a wound that was 7 mm in diameter 1 mm deep.  There was good bleeding no  purulent drainage.  Patient has some chronic swelling of the tip of the second toe.  No cellulitis or swelling in any other toes.  The radiograph shows chronic osteomyelitis of the tuft of the second and fourth toes.  Imaging: No results found. No images are attached to the encounter.  Labs: Lab Results  Component Value Date   HGBA1C 5.3 04/12/2017     No results found for: ALBUMIN, PREALBUMIN, LABURIC  Body mass index is 39.16 kg/m.  Orders:  No orders of the defined types were placed in this encounter.  No orders of the defined types were placed in this encounter.    Procedures: No procedures performed  Clinical Data: No additional findings.  ROS:  All other systems negative, except as noted in the HPI. Review of Systems  Objective: Vital Signs: Ht 5\' 7"  (1.702 m)    Wt 250 lb (113.4 kg)    BMI 39.16 kg/m   Specialty Comments:  No specialty comments available.  PMFS History: Patient Active Problem List   Diagnosis Date Noted   Atrial fibrillation ([place]) 04/15/2017   Class 3 severe obesity with body mass index (BMI) of 40.0 to 44.9 in adult [facility]) 04/15/2017   Hx of sleep apnea 04/15/2017   Past Medical History:  Diagnosis Date   Atrial fibrillation ([place]) 2016   Hypertension 2016  Sleep apnea 2016    Family History  Problem Relation Age of Onset   Diabetes Mother    Heart disease Father    Heart disease Brother    Cancer Brother        lung cancer    Past Surgical History:  Procedure Laterality Date   KNEE SURGERY Bilateral    toe removal Right    tip of middle toe   Social History   Occupational History   Not on file  Tobacco Use   Smoking status: Former Smoker   Smokeless tobacco: Never Used   Tobacco comment: stop smoke 30years  Substance and Sexual Activity   Alcohol use: No    Frequency: Never   Drug use: No   Sexual activity: Not on file

## 2018-07-14 ENCOUNTER — Ambulatory Visit (INDEPENDENT_AMBULATORY_CARE_PROVIDER_SITE_OTHER): Payer: Self-pay | Admitting: Physician Assistant

## 2018-07-15 ENCOUNTER — Encounter (HOSPITAL_COMMUNITY): Payer: Self-pay | Admitting: *Deleted

## 2018-07-15 ENCOUNTER — Other Ambulatory Visit: Payer: Self-pay

## 2018-07-15 ENCOUNTER — Other Ambulatory Visit (INDEPENDENT_AMBULATORY_CARE_PROVIDER_SITE_OTHER): Payer: Self-pay | Admitting: Orthopedic Surgery

## 2018-07-15 NOTE — Progress Notes (Signed)
Anesthesia Chart Review: SAME DAY WORK-UP   Case:  [id number] Date/Time:  07/16/18 0815   Procedure:  AMPUTATION DIP (DISTAL INTERPHALANGEAL) JOINT 2ND AND 4TH TOE RIGHT FOOT (Right )   Anesthesia type:  Choice   Pre-op diagnosis:  Osteomyelitis Right 4th and 2nd Toe   Location:  [facility] OR ROOM 01 / [facility]   Surgeon:  Newt Minion, MD      DISCUSSION: Patient is a 72 year old male scheduled for the above procedure.  History includes chronic afib (diagnosed 2011, [place]; recurrent afib following DCCV 2014), OSA (no using CPAP), obesity, former smoker, HTN, right third toe partial amputation.   Patient on ASA for chronic afib. Rate was controlled and patient asymptomatic from his afib, so discharged from the [facility] after 09/2017 visit with PRN follow-up recommended.   Patient is a same day work-up, so he will get vitals, labs, and evaluation from his anesthesia team prior to planned surgery. Definitive plan at that time.     VS: Ht 5\' 7"  (1.702 m)   Wt 114.3 kg   BMI 39.47 kg/m     PROVIDERS: Nche, Charlene Brooke, NP is listed as PCP. PCP documented by our PAT phone RN as Horald Pollen, MD. - Seen at [facility] on 10/01/17 by Chanetta Marshall, NP. According to note, "CHADS2VASC is 1.  We discussed options today including no OAC, ASA, or OAC based on guidelines.  He will continue ASA 81mg  daily.  As he is asymptomatic and with duration of AF, I would not pursue rhythm control strategy." PRN Afib Clinic follow-up recommended.    LABS: He is for labs on the day of surgery. Cr and H/H normal in 09/2017.   IMAGES: CXR 09/17/17: IMPRESSION: No active cardiopulmonary disease. Calcific atherosclerotic disease and tortuosity of the aorta. Mildly enlarged cardiac silhouette.   EKG: 10/01/17: Atrial fibrillation Rightward axis Low voltage QRS Incomplete right bundle branch block Abnormal ECG Confirmed by Larae Grooms [phone]) on 10/01/2017 6:18:03 PM   CV: RLE venous  US 03/20/18 ([place]): IMPRESSION: No evidence of deep venous thrombosis in the right lower extremity.  Echo 04/19/17: Study Conclusions - Left ventricle: The cavity size was normal. Systolic function was   normal. The estimated ejection fraction was in the range of 60%   to 65%. Although no diagnostic regional wall motion abnormality   was identified, this possibility cannot be completely excluded on   the basis of this study. - Mitral valve: Calcified annulus. - Left atrium: The atrium was moderately dilated.   Past Medical History:  Diagnosis Date  . Atrial fibrillation ([place]) 2016  . Hypertension 2016   no longer an issue per patient 07/15/2018  . Pneumonia 05/2018  . Sleep apnea 2016   Does not use CPAP  . Wears glasses    reading  . Wears partial dentures    upper    Past Surgical History:  Procedure Laterality Date  . COLONOSCOPY     Normal  . FOOT SURGERY Right    x 3 minor procedures  . HERNIA REPAIR    . KNEE SURGERY Bilateral   . toe removal Right    tip of middle toe    MEDICATIONS: No current facility-administered medications for this encounter.    Marland Kitchen aspirin EC 81 MG tablet  . doxycycline (VIBRA-TABS) 100 MG tablet  . ibuprofen (ADVIL,MOTRIN) 200 MG tablet  . Magnesium 500 MG TABS  . Multiple Vitamin (MULTIVITAMIN WITH MINERALS) TABS  tablet  . NON FORMULARY    Myra Gianotti, PA-C Surgical Short Stay/Anesthesiology [facility] Phone [phone] [facility] Phone [phone] 07/15/2018 12:37 PM

## 2018-07-15 NOTE — Progress Notes (Signed)
Spoke with patient regarding pre-op instrucutions for DOS.  Patient denies SOB, Chest pain, Fever, Cough, denies respiratory issues.  Patient was instructed to stop now all vitamins, supplements, Ibuprofen, NSAIDS, Goody's, BC powder and fish oil.  May take tylenol if needed.  Dr Sharol Given did not instruct patient to stop aspirin.  Last dose of aspirin 07/15/2018.    PCP - Dr Horald Pollen Cardiologist - Denies, Saw Roderic Palau, NP @ [facility] in 09/2017  Chest x-ray - 09/17/17 EKG - 10/01/17 Stress Test - denies ECHO - 04/19/17 Cardiac Cath - denies  Sleep Study - Done years ago [place] CPAP - Does not use CPAP   Coronavirus Screening  Have you or your wife Verdis Frederickson experienced the following symptoms:  Cough yes/no: No Fever (>100.32F)  yes/no: No Runny nose yes/no: No Sore throat yes/no: No Difficulty breathing/shortness of breath  yes/no: No  Have you or your wife Verdis Frederickson traveled in the last 14 days and where? yes/no: No  Patient informed of the hospital visitation restrictions that are now in place.  Patient verbalized understanding of all pre-op instructions for DOS.

## 2018-07-15 NOTE — Anesthesia Preprocedure Evaluation (Addendum)
Anesthesia Evaluation  Patient identified by MRN, date of birth, ID band Patient awake    Reviewed: Allergy & Precautions, NPO status , Patient's Chart, lab work & pertinent test results  Airway Mallampati: III  TM Distance: >3 FB     Dental  (+) Dental Advisory Given   Pulmonary sleep apnea , former smoker,    breath sounds clear to auscultation       Cardiovascular hypertension, Pt. on medications + dysrhythmias  Rhythm:Irregular Rate:Normal     Neuro/Psych negative neurological ROS     GI/Hepatic negative GI ROS, Neg liver ROS,   Endo/Other  Morbid obesity  Renal/GU negative Renal ROS     Musculoskeletal   Abdominal   Peds  Hematology negative hematology ROS (+)   Anesthesia Other Findings   Reproductive/Obstetrics                           Lab Results  Component Value Date   WBC 7.7 09/17/2017   HGB 15.9 09/17/2017   HCT 46.3 09/17/2017   MCV 91.7 09/17/2017   PLT 178 09/17/2017   Lab Results  Component Value Date   CREATININE 0.87 09/17/2017   BUN 16 09/17/2017   NA 133 (L) 09/17/2017   K 4.4 09/17/2017   CL 102 09/17/2017   CO2 22 09/17/2017   Echo 04/19/17: Study Conclusions - Left ventricle: The cavity size was normal. Systolic function was normal. The estimated ejection fraction was in the range of 60% to 65%. Although no diagnostic regional wall motion abnormality was identified, this possibility cannot be completely excluded on the basis of this study. - Mitral valve: Calcified annulus. - Left atrium: The atrium was moderately dilated. Anesthesia Physical Anesthesia Plan  ASA: III  Anesthesia Plan: General   Post-op Pain Management:    Induction: Intravenous  PONV Risk Score and Plan: 2 and Dexamethasone, Ondansetron and Treatment may vary due to age or medical condition  Airway Management Planned: Oral ETT  Additional Equipment:   Intra-op  Plan:   Post-operative Plan: Extubation in OR  Informed Consent: I have reviewed the patients History and Physical, chart, labs and discussed the procedure including the risks, benefits and alternatives for the proposed anesthesia with the patient or authorized representative who has indicated his/her understanding and acceptance.     Dental advisory given  Plan Discussed with: CRNA  Anesthesia Plan Comments: ( )      Anesthesia Quick Evaluation

## 2018-07-16 ENCOUNTER — Encounter (HOSPITAL_COMMUNITY)
Admission: RE | Disposition: A | Discharge status: Home / Self Care | Payer: Self-pay | Source: Home / Self Care | Attending: Orthopedic Surgery

## 2018-07-16 ENCOUNTER — Ambulatory Visit (HOSPITAL_COMMUNITY)
Admission: RE | Admit: 2018-07-16 | Discharge: 2018-07-16 | Disposition: A | Discharge status: Home / Self Care | Payer: Medicare PPO | Attending: Orthopedic Surgery | Admitting: Orthopedic Surgery

## 2018-07-16 ENCOUNTER — Ambulatory Visit (HOSPITAL_COMMUNITY): Payer: Medicare PPO | Admitting: Vascular Surgery

## 2018-07-16 ENCOUNTER — Encounter (HOSPITAL_COMMUNITY): Payer: Self-pay

## 2018-07-16 ENCOUNTER — Other Ambulatory Visit: Payer: Self-pay

## 2018-07-16 DIAGNOSIS — Z6839 Body mass index (BMI) 39.0-39.9, adult: Secondary | ICD-10-CM | POA: Insufficient documentation

## 2018-07-16 DIAGNOSIS — Z791 Long term (current) use of non-steroidal anti-inflammatories (NSAID): Secondary | ICD-10-CM | POA: Insufficient documentation

## 2018-07-16 DIAGNOSIS — G4730 Sleep apnea, unspecified: Secondary | ICD-10-CM | POA: Diagnosis not present

## 2018-07-16 DIAGNOSIS — M86671 Other chronic osteomyelitis, right ankle and foot: Secondary | ICD-10-CM | POA: Diagnosis not present

## 2018-07-16 DIAGNOSIS — Z87891 Personal history of nicotine dependence: Secondary | ICD-10-CM | POA: Insufficient documentation

## 2018-07-16 DIAGNOSIS — L03031 Cellulitis of right toe: Secondary | ICD-10-CM

## 2018-07-16 DIAGNOSIS — I4891 Unspecified atrial fibrillation: Secondary | ICD-10-CM | POA: Insufficient documentation

## 2018-07-16 DIAGNOSIS — L03115 Cellulitis of right lower limb: Secondary | ICD-10-CM | POA: Insufficient documentation

## 2018-07-16 DIAGNOSIS — Z7982 Long term (current) use of aspirin: Secondary | ICD-10-CM | POA: Diagnosis not present

## 2018-07-16 DIAGNOSIS — M869 Osteomyelitis, unspecified: Secondary | ICD-10-CM

## 2018-07-16 DIAGNOSIS — I10 Essential (primary) hypertension: Secondary | ICD-10-CM | POA: Diagnosis not present

## 2018-07-16 HISTORY — DX: Presence of spectacles and contact lenses: Z97.3

## 2018-07-16 HISTORY — DX: Presence of dental prosthetic device (complete) (partial): Z97.2

## 2018-07-16 HISTORY — DX: Pneumonia, unspecified organism: J18.9

## 2018-07-16 HISTORY — PX: AMPUTATION: SHX166

## 2018-07-16 LAB — COMPREHENSIVE METABOLIC PANEL
ALT: 23 U/L (ref 0–44)
AST: 23 U/L (ref 15–41)
Albumin: 3.5 g/dL (ref 3.5–5.0)
Alkaline Phosphatase: 48 U/L (ref 38–126)
Anion gap: 9 (ref 5–15)
BUN: 17 mg/dL (ref 8–23)
CO2: 23 mmol/L (ref 22–32)
Calcium: 9.1 mg/dL (ref 8.9–10.3)
Chloride: 106 mmol/L (ref 98–111)
Creatinine, Ser: 0.93 mg/dL (ref 0.61–1.24)
GFR calc Af Amer: >60 mL/min (ref >60)
GFR calc non Af Amer: >60 mL/min (ref >60)
Glucose, Bld: 110 mg/dL — ABNORMAL HIGH (ref 70–99)
Potassium: 4 mmol/L (ref 3.5–5.1)
Sodium: 138 mmol/L (ref 135–145)
Total Bilirubin: 1.1 mg/dL (ref 0.3–1.2)
Total Protein: 6.8 g/dL (ref 6.5–8.1)

## 2018-07-16 LAB — CBC
HCT: 49.1 % (ref 39.0–52.0)
Hemoglobin: 15.9 g/dL (ref 13.0–17.0)
MCH: 30.5 pg (ref 26.0–34.0)
MCHC: 32.4 g/dL (ref 30.0–36.0)
MCV: 94.1 fL (ref 80.0–100.0)
Platelets: 227 10*3/uL (ref 150–400)
RBC: 5.22 MIL/uL (ref 4.22–5.81)
RDW: 13.3 % (ref 11.5–15.5)
WBC: 6.1 10*3/uL (ref 4.0–10.5)
nRBC: 0 % (ref 0.0–0.2)

## 2018-07-16 SURGERY — AMPUTATION DIGIT
Anesthesia: General | Site: Foot | Laterality: Right

## 2018-07-16 MED ORDER — MIDAZOLAM HCL 5 MG/5ML IJ SOLN
INTRAMUSCULAR | Status: DC | PRN
  Administered 2018-07-16: 1 mg via INTRAVENOUS

## 2018-07-16 MED ORDER — LIDOCAINE 2% (20 MG/ML) 5 ML SYRINGE
INTRAMUSCULAR | Status: DC | PRN
  Administered 2018-07-16: 60 mg via INTRAVENOUS

## 2018-07-16 MED ORDER — BUPIVACAINE-EPINEPHRINE (PF) 0.5% -1:200000 IJ SOLN
INTRAMUSCULAR | Status: AC
  Filled 2018-07-16: qty 30

## 2018-07-16 MED ORDER — PROPOFOL 10 MG/ML IV BOLUS
INTRAVENOUS | Status: DC | PRN
  Administered 2018-07-16: 150 mg via INTRAVENOUS

## 2018-07-16 MED ORDER — HYDROCODONE-ACETAMINOPHEN 5-325 MG PO TABS: 1.0000 | ORAL_TABLET | ORAL | 0 refills | Status: DC | PRN

## 2018-07-16 MED ORDER — 0.9 % SODIUM CHLORIDE (POUR BTL) OPTIME
TOPICAL | Status: DC | PRN
  Administered 2018-07-16: 09:00:00 1000 mL

## 2018-07-16 MED ORDER — CEFAZOLIN SODIUM-DEXTROSE 2-4 GM/100ML-% IV SOLN
INTRAVENOUS | Status: AC
  Filled 2018-07-16: qty 100

## 2018-07-16 MED ORDER — DEXAMETHASONE SODIUM PHOSPHATE 10 MG/ML IJ SOLN
INTRAMUSCULAR | Status: DC | PRN
  Administered 2018-07-16: 5 mg via INTRAVENOUS

## 2018-07-16 MED ORDER — SUCCINYLCHOLINE CHLORIDE 20 MG/ML IJ SOLN
INTRAMUSCULAR | Status: DC | PRN
  Administered 2018-07-16: 100 mg via INTRAVENOUS

## 2018-07-16 MED ORDER — CHLORHEXIDINE GLUCONATE 4 % EX LIQD: 60.0000 mL | Freq: Once | CUTANEOUS | Status: DC

## 2018-07-16 MED ORDER — BUPIVACAINE HCL (PF) 0.5 % IJ SOLN
INTRAMUSCULAR | Status: AC
  Filled 2018-07-16: qty 30

## 2018-07-16 MED ORDER — LACTATED RINGERS IV SOLN
INTRAVENOUS | Status: DC | PRN
  Administered 2018-07-16: 08:00:00 via INTRAVENOUS

## 2018-07-16 MED ORDER — SODIUM CHLORIDE 0.9 % IV SOLN
INTRAVENOUS | Status: DC | PRN
  Administered 2018-07-16: 09:00:00 50 ug/min via INTRAVENOUS

## 2018-07-16 MED ORDER — FENTANYL CITRATE (PF) 250 MCG/5ML IJ SOLN
INTRAMUSCULAR | Status: DC | PRN
  Administered 2018-07-16: 100 ug via INTRAVENOUS

## 2018-07-16 MED ORDER — PROPOFOL 10 MG/ML IV BOLUS
INTRAVENOUS | Status: AC
  Filled 2018-07-16: qty 20

## 2018-07-16 MED ORDER — CEFAZOLIN SODIUM-DEXTROSE 2-4 GM/100ML-% IV SOLN
2.0000 g | INTRAVENOUS | Status: AC
  Administered 2018-07-16: 2 g via INTRAVENOUS

## 2018-07-16 MED ORDER — MIDAZOLAM HCL 2 MG/2ML IJ SOLN
INTRAMUSCULAR | Status: AC
  Filled 2018-07-16: qty 2

## 2018-07-16 MED ORDER — ONDANSETRON HCL 4 MG/2ML IJ SOLN
INTRAMUSCULAR | Status: DC | PRN
  Administered 2018-07-16: 4 mg via INTRAVENOUS

## 2018-07-16 MED ORDER — FENTANYL CITRATE (PF) 250 MCG/5ML IJ SOLN
INTRAMUSCULAR | Status: AC
  Filled 2018-07-16: qty 5

## 2018-07-16 MED ORDER — FENTANYL CITRATE (PF) 100 MCG/2ML IJ SOLN: 25.0000 ug | INTRAMUSCULAR | Status: DC | PRN

## 2018-07-16 MED ORDER — ONDANSETRON HCL 4 MG/2ML IJ SOLN: 4.0000 mg | Freq: Once | INTRAMUSCULAR | Status: DC | PRN

## 2018-07-16 MED ORDER — BUPIVACAINE HCL (PF) 0.5 % IJ SOLN
INTRAMUSCULAR | Status: DC | PRN
  Administered 2018-07-16: 10 mL

## 2018-07-16 SURGICAL SUPPLY — 30 items
BLADE SURG 21 STRL SS (BLADE) ×3 IMPLANT
BNDG COHESIVE 4X5 TAN STRL (GAUZE/BANDAGES/DRESSINGS) ×3 IMPLANT
BNDG GAUZE ELAST 4 BULKY (GAUZE/BANDAGES/DRESSINGS) ×3 IMPLANT
CHLORAPREP W/TINT 26 (MISCELLANEOUS) ×3 IMPLANT
COVER SURGICAL LIGHT HANDLE (MISCELLANEOUS) ×3 IMPLANT
DRAPE U-SHAPE 47X51 STRL (DRAPES) ×3 IMPLANT
DRSG ADAPTIC 3X8 NADH LF (GAUZE/BANDAGES/DRESSINGS) ×3 IMPLANT
DRSG PAD ABDOMINAL 8X10 ST (GAUZE/BANDAGES/DRESSINGS) ×3 IMPLANT
ELECT REM PT RETURN 9FT ADLT (ELECTROSURGICAL) ×3
ELECTRODE REM PT RTRN 9FT ADLT (ELECTROSURGICAL) ×1 IMPLANT
GAUZE SPONGE 4X4 12PLY STRL (GAUZE/BANDAGES/DRESSINGS) ×3 IMPLANT
GLOVE BIO SURGEON STRL SZ7 (GLOVE) ×3 IMPLANT
GLOVE BIOGEL PI IND STRL 6.5 (GLOVE) ×1 IMPLANT
GLOVE BIOGEL PI IND STRL 9 (GLOVE) ×1 IMPLANT
GLOVE BIOGEL PI INDICATOR 6.5 (GLOVE) ×2
GLOVE BIOGEL PI INDICATOR 9 (GLOVE) ×2
GLOVE SURG ORTHO 9.0 STRL STRW (GLOVE) ×3 IMPLANT
GLOVE SURG SS PI 6.0 STRL IVOR (GLOVE) ×3 IMPLANT
GOWN STRL REUS W/ TWL LRG LVL3 (GOWN DISPOSABLE) ×2 IMPLANT
GOWN STRL REUS W/ TWL XL LVL3 (GOWN DISPOSABLE) ×1 IMPLANT
GOWN STRL REUS W/TWL LRG LVL3 (GOWN DISPOSABLE) ×4
GOWN STRL REUS W/TWL XL LVL3 (GOWN DISPOSABLE) ×2
KIT BASIN OR (CUSTOM PROCEDURE TRAY) ×3 IMPLANT
KIT TURNOVER KIT B (KITS) ×3 IMPLANT
NEEDLE 22X1 1/2 (OR ONLY) (NEEDLE) ×3 IMPLANT
NS IRRIG 1000ML POUR BTL (IV SOLUTION) ×3 IMPLANT
PACK ORTHO EXTREMITY (CUSTOM PROCEDURE TRAY) ×3 IMPLANT
PAD ARMBOARD 7.5X6 YLW CONV (MISCELLANEOUS) ×6 IMPLANT
SUT ETHILON 2 0 PSLX (SUTURE) ×3 IMPLANT
SYR CONTROL 10ML LL (SYRINGE) ×3 IMPLANT

## 2018-07-16 NOTE — Transfer of Care (Signed)
Immediate Anesthesia Transfer of Care Note  Patient: Ricky Dorsey  Procedure(s) Performed: AMPUTATION DIP (DISTAL INTERPHALANGEAL) JOINT 2ND AND 4TH TOE RIGHT FOOT (Right Foot)  Patient Location: PACU  Anesthesia Type:General  Level of Consciousness: awake, alert  and oriented  Airway & Oxygen Therapy: Patient Spontanous Breathing and Patient connected to face mask oxygen  Post-op Assessment: Report given to RN, Post -op Vital signs reviewed and stable and Patient moving all extremities X 4  Post vital signs: Reviewed and stable  Last Vitals:  Vitals Value Taken Time  BP 162/104 07/16/2018  9:06 AM  Temp    Pulse 99 07/16/2018  9:06 AM  Resp 18 07/16/2018  9:06 AM  SpO2 99 % 07/16/2018  9:06 AM  Vitals shown include unvalidated device data.  Last Pain:  Vitals:   07/16/18 0638  TempSrc: Oral  PainSc: 7       Patients Stated Pain Goal: 0 (06/34/94 9447)  Complications: No apparent anesthesia complications

## 2018-07-16 NOTE — Anesthesia Postprocedure Evaluation (Signed)
Anesthesia Post Note  Patient: Designer, jewellery  Procedure(s) Performed: AMPUTATION DIP (DISTAL INTERPHALANGEAL) JOINT 2ND AND 4TH TOE RIGHT FOOT (Right Foot)     Patient location during evaluation: PACU Anesthesia Type: General Level of consciousness: awake and alert Pain management: pain level controlled Vital Signs Assessment: post-procedure vital signs reviewed and stable Respiratory status: spontaneous breathing, nonlabored ventilation, respiratory function stable and patient connected to nasal cannula oxygen Cardiovascular status: blood pressure returned to baseline and stable Postop Assessment: no apparent nausea or vomiting Anesthetic complications: no    Last Vitals:  Vitals:   07/16/18 0930 07/16/18 0935  BP:    Pulse: 97   Resp:    Temp:  (!) 36.4 C  SpO2: 94%     Last Pain:  Vitals:   07/16/18 0930  TempSrc:   PainSc: 0-No pain                 Tiajuana Amass

## 2018-07-16 NOTE — Anesthesia Procedure Notes (Signed)
Procedure Name: Intubation Date/Time: 07/16/2018 8:37 AM Performed by: Mariea Clonts, CRNA Pre-anesthesia Checklist: Patient identified, Emergency Drugs available, Suction available and Patient being monitored Patient Re-evaluated:Patient Re-evaluated prior to induction Oxygen Delivery Method: Circle System Utilized Preoxygenation: Pre-oxygenation with 100% oxygen Induction Type: IV induction Ventilation: Mask ventilation without difficulty Laryngoscope Size: Miller and 2 Grade View: Grade I Tube type: Oral Tube size: 7.5 mm Number of attempts: 1 Airway Equipment and Method: Stylet and Oral airway Placement Confirmation: ETT inserted through vocal cords under direct vision,  positive ETCO2 and breath sounds checked- equal and bilateral Tube secured with: Tape Dental Injury: Teeth and Oropharynx as per pre-operative assessment

## 2018-07-16 NOTE — H&P (Signed)
Ricky Dorsey is an 73 y.o. male.   Chief Complaint: recurrent ulceration and cellulitis right foot 2nd and 4th toes HPI: Patient is a 73 year old gentleman who presents with acute cellulitis right foot fourth toe with chronic osteomyelitis second and fourth toes.  He has been wearing the Vive knee-high compression stockings he has been wearing Hoka sneakers with the sole orthotics.  Patient states the callus continues to recur on the fourth toe and he has had cellulitis 3 times and has chronic ulceration to the 2nd and 4 toes.  Past Medical History:  Diagnosis Date  . Atrial fibrillation ([place]) 2016  . Hypertension 2016   no longer an issue per patient 07/15/2018  . Pneumonia 05/2018  . Sleep apnea 2016   Does not use CPAP  . Wears glasses    reading  . Wears partial dentures    upper    Past Surgical History:  Procedure Laterality Date  . COLONOSCOPY     Normal  . FOOT SURGERY Right    x 3 minor procedures  . HERNIA REPAIR    . KNEE SURGERY Bilateral   . toe removal Right    tip of middle toe    Family History  Problem Relation Age of Onset  . Diabetes Mother   . Heart disease Father   . Heart disease Brother   . Cancer Brother        lung cancer   Social History:  reports that he has quit smoking. His smoking use included cigarettes and pipe. He has never used smokeless tobacco. He reports current alcohol use. He reports that he does not use drugs.  Allergies:  Allergies  Allergen Reactions  . Azithromycin Swelling, Rash and Other (See Comments)    Leg swelling with rash    Medications Prior to Admission  Medication Sig Dispense Refill  . aspirin EC 81 MG tablet Take 81 mg by mouth at bedtime.    Marland Kitchen doxycycline (VIBRA-TABS) 100 MG tablet Take 1 tablet (100 mg total) by mouth 2 (two) times daily. 60 tablet 0  . ibuprofen (ADVIL,MOTRIN) 200 MG tablet Take 800 mg by mouth 2 (two) times daily as needed (for pain or headaches).     . Magnesium 500 MG TABS Take 500 mg by  mouth at bedtime.     . Multiple Vitamin (MULTIVITAMIN WITH MINERALS) TABS tablet Take 1 tablet by mouth daily.    . NON FORMULARY Apply 1 application topically See admin instructions. "Soothing Blend" Essential Oil(s): Apply topically as needed to provide comfort      No results found for this or any previous visit (from the past 48 hour(s)). No results found.  Review of Systems  All other systems reviewed and are negative.   Blood pressure (!) 176/108, pulse (!) 104, temperature 97.8 F (36.6 C), temperature source Oral, resp. rate 18, height 5\' 7"  (1.702 m), weight 114.3 kg. Physical Exam  Patient is alert, oriented, no adenopathy, well-dressed, normal affect, normal respiratory effort. Examination patient has a strong dorsalis pedis and posterior tibial pulse.  He does have venous stasis changes but no venous ulcers.  There is acute cellulitis of the right fourth toe with sausage digit swelling.    Patient has some chronic swelling of the tip of the second toe.  No cellulitis or swelling in any other toes.  The radiograph shows chronic osteomyelitis of the tuft of the second and fourth toes. Assessment/Plan Assessment: Chronic osteomyelitis right foot second and fourth toes.  Plan: Discussed with the patient with the swelling cellulitis and osteomyelitis of the tuft of the fourth toe would recommend amputation of the fourth toe through the PIP joint.  And with the chronic osteomyelitis of the tuft of the second toe would recommend amputating the second toe through the middle phalanx.  Discussed that he would be out of work for about 2 weeks.  A prescription for doxycycline was called in,risks and benefits were discussed, patient and his wife wish to proceed. Newt Minion, MD 07/16/2018, 6:51 AM

## 2018-07-16 NOTE — Op Note (Signed)
07/16/2018  9:04 AM  PATIENT:  [name]    PRE-OPERATIVE DIAGNOSIS:  Osteomyelitis Right 4th and 2nd Toe  POST-OPERATIVE DIAGNOSIS:  Same  PROCEDURE:  AMPUTATION  2ND AND 4TH TOE RIGHT FOOT  SURGEON:  Newt Minion, MD  PHYSICIAN ASSISTANT:None ANESTHESIA:   General  PREOPERATIVE INDICATIONS:  [name] is a  73 y.o. male with a diagnosis of Osteomyelitis Right 4th and 2nd Toe who failed conservative measures and elected for surgical management.    The risks benefits and alternatives were discussed with the patient preoperatively including but not limited to the risks of infection, bleeding, nerve injury, cardiopulmonary complications, the need for revision surgery, among others, and the patient was willing to proceed.  OPERATIVE IMPLANTS: None  @ENCIMAGES @  OPERATIVE FINDINGS: No abscess or osteomyelitis at the level of amputation.  OPERATIVE PROCEDURE: Patient was brought the operating room underwent a general anesthetic.  After adequate levels anesthesia were obtained patient's right lower extremity was prepped using ChloraPrep draped into a sterile field a timeout was called.  Attention was first focused on the right foot second toe.  A fishmouth incision was made through the middle of the toe the toe was amputated through the middle phalanx.  The wound was irrigated normal saline there was a small amount of petechial bleeding the incision was closed using 2-0 nylon.  Attention was then focused on the fourth toe.  A fishmouth incision was made in the fourth toe was amputated through the PIP joint.  There was a small amount of petechial bleeding there is no signs of abscess or infection at either toe at the level of amputation.  The wound was irrigated and incision closed using 2-0 nylon.  A sterile dressing was applied patient was extubated taken the PACU in stable condition.   DISCHARGE PLANNING:  Antibiotic duration: Preoperative antibiotics continue doxycycline after  discharge  Weightbearing: Touchdown weightbearing on the right  Pain medication: Prescription sent for Vicodin  Dressing care/ Wound VAC: Follow-up in 1 week to change the dressing  Ambulatory devices: Crutches  Discharge to: Home  Follow-up: In the office 1 week post operative.

## 2018-07-16 NOTE — Progress Notes (Signed)
Orthopedic Tech Progress Note Patient Details:  Ricky Dorsey 07/04/1945 [id number]  Ortho Devices Type of Ortho Device: Crutches, Postop shoe/boot Ortho Device/Splint Location: LLE Ortho Device/Splint Interventions: Adjustment, Application, Ordered   Post Interventions Patient Tolerated: Well Instructions Provided: Poper ambulation with device, Care of device, Adjustment of device   Janit Pagan 07/16/2018, 9:43 AM

## 2018-07-17 ENCOUNTER — Encounter (HOSPITAL_COMMUNITY): Payer: Self-pay | Admitting: Orthopedic Surgery

## 2018-07-23 ENCOUNTER — Telehealth (INDEPENDENT_AMBULATORY_CARE_PROVIDER_SITE_OTHER): Payer: Self-pay

## 2018-07-23 NOTE — Telephone Encounter (Signed)
Called pt and asked all prescreen COVID-19 questions. Answered NO to all. Has an appt tomorrow at 9:45

## 2018-07-24 ENCOUNTER — Encounter (INDEPENDENT_AMBULATORY_CARE_PROVIDER_SITE_OTHER): Payer: Self-pay | Admitting: Orthopedic Surgery

## 2018-07-24 ENCOUNTER — Other Ambulatory Visit: Payer: Self-pay

## 2018-07-24 ENCOUNTER — Ambulatory Visit (INDEPENDENT_AMBULATORY_CARE_PROVIDER_SITE_OTHER): Payer: Medicare PPO | Admitting: Physician Assistant

## 2018-07-24 VITALS — Ht 67.0 in | Wt 252.0 lb

## 2018-07-24 DIAGNOSIS — Z89421 Acquired absence of other right toe(s): Secondary | ICD-10-CM

## 2018-07-24 NOTE — Progress Notes (Signed)
Office Visit Note   Patient: Ricky Dorsey           Date of Birth: March 13, 1946           MRN: [id number] Visit Date: 07/24/2018              Requested by: Flossie Buffy, [address] [place], [place] [address] PCP: Flossie Buffy, NP  Chief Complaint  Patient presents with  . Right Foot - Routine Post Op    07/16/2018 right foot amputation 2nd and 4th toe.       HPI: The patient is a 73 yo gentleman who is seen for post operative follow up following amputation of the right 2nd and 4th distal toe amputations 07/16/2018. He has been weight bearing in a post op shoe. He is on Doxycycline post op.   Assessment & Plan: Visit Diagnoses:  1. History of complete ray amputation of second toe of right foot ([place])   2. History of partial ray amputation of fourth toe of right foot ([place])     Plan: The patient was instructed to begin washing the foot with Dial soap and water and apply a dry dressing to the area daily.  Continue doxycycline 100 mg p.o. twice daily.  Instructed to continue to offload and elevate the foot as much as possible.  He was also given written information concerning whey protein supplementation, vitamins, and probiotic supplementation to assist with wound healing.  He will follow-up in 1 week.  Follow-Up Instructions: Return in about 1 week (around 07/31/2018).   Ortho Exam  Patient is alert, oriented, no adenopathy, well-dressed, normal affect, normal respiratory effort. Right foot partial amputations of second and fourth toe with sutures intact.  There is no drainage.  There is some old crusting dried blood around the incisional area.  Very mild erythema and edema.  No signs of cellulitis of the foot.  Pedal pulses are intact.  Imaging: No results found.   Labs: Lab Results  Component Value Date   HGBA1C 5.3 04/12/2017     Lab Results  Component Value Date   ALBUMIN 3.5 07/16/2018    Body mass index is 39.47 kg/m.  Orders:  No orders  of the defined types were placed in this encounter.  No orders of the defined types were placed in this encounter.    Procedures: No procedures performed  Clinical Data: No additional findings.  ROS:  All other systems negative, except as noted in the HPI. Review of Systems  Objective: Vital Signs: Ht 5\' 7"  (1.702 m)   Wt 252 lb (114.3 kg)   BMI 39.47 kg/m   Specialty Comments:  No specialty comments available.  PMFS History: Patient Active Problem List   Diagnosis Date Noted  . Cellulitis of fourth toe of right foot 07/10/2018  . Osteomyelitis of fourth toe of right foot ([place]) 07/10/2018  . Osteomyelitis of second toe of right foot ([place]) 07/10/2018  . Atrial fibrillation ([place]) 04/15/2017  . Class 3 severe obesity with body mass index (BMI) of 40.0 to 44.9 in adult [facility]) 04/15/2017  . Hx of sleep apnea 04/15/2017   Past Medical History:  Diagnosis Date  . Atrial fibrillation ([place]) 2016  . Hypertension 2016   no longer an issue per patient 07/15/2018  . Pneumonia 05/2018  . Sleep apnea 2016   Does not use CPAP  . Wears glasses    reading  . Wears partial dentures    upper  Family History  Problem Relation Age of Onset  . Diabetes Mother   . Heart disease Father   . Heart disease Brother   . Cancer Brother        lung cancer    Past Surgical History:  Procedure Laterality Date  . AMPUTATION Right 07/16/2018   Procedure: AMPUTATION DIP (DISTAL INTERPHALANGEAL) JOINT 2ND AND 4TH TOE RIGHT FOOT;  Surgeon: Newt Minion, MD;  Location: [facility];  Service: Orthopedics;  Laterality: Right;  . COLONOSCOPY     Normal  . FOOT SURGERY Right    x 3 minor procedures  . HERNIA REPAIR    . KNEE SURGERY Bilateral   . toe removal Right    tip of middle toe   Social History   Occupational History  . Not on file  Tobacco Use  . Smoking status: Former Smoker    Types: Cigarettes, Pipe  . Smokeless tobacco: Never Used  . Tobacco comment: stop smoking 30 years ago   Substance and Sexual Activity  . Alcohol use: Yes    Frequency: Never    Comment: Rum 2-3 times per week  . Drug use: No  . Sexual activity: Not on file

## 2018-07-31 ENCOUNTER — Other Ambulatory Visit: Payer: Self-pay

## 2018-07-31 ENCOUNTER — Encounter (INDEPENDENT_AMBULATORY_CARE_PROVIDER_SITE_OTHER): Payer: Self-pay | Admitting: Orthopedic Surgery

## 2018-07-31 ENCOUNTER — Ambulatory Visit (INDEPENDENT_AMBULATORY_CARE_PROVIDER_SITE_OTHER): Payer: Medicare PPO | Admitting: Orthopedic Surgery

## 2018-07-31 VITALS — Ht 67.0 in | Wt 252.0 lb

## 2018-07-31 DIAGNOSIS — Z89421 Acquired absence of other right toe(s): Secondary | ICD-10-CM

## 2018-07-31 NOTE — Progress Notes (Signed)
Office Visit Note   Patient: Ricky Dorsey           Date of Birth: 1945/12/10           MRN: [id number] Visit Date: 07/31/2018              Requested by: Flossie Buffy, [address] [place], [place] [address] PCP: Flossie Buffy, NP  Chief Complaint  Patient presents with  . Right Foot - Routine Post Op    07/16/2018 right foot amp 2nd & 4th toe      HPI: Patient is a 73 year old gentleman who presents in follow-up status post partial toe amputations right foot second and fourth toe.  Patient states that the fifth toe is now underneath the fourth toe.  He is completing his course of doxycycline patient is 2 and half weeks out from surgery.  Assessment & Plan: Visit Diagnoses:  1. History of complete ray amputation of second toe of right foot ([place])   2. History of partial ray amputation of fourth toe of right foot ([place])     Plan: Harvest the sutures today he will advance to a stiff soled walking sneaker  Follow-Up Instructions: Return in about 2 months (around 09/30/2018).   Ortho Exam  Patient is alert, oriented, no adenopathy, well-dressed, normal affect, normal respiratory effort. Examination patient has a good pulse there is no redness no cellulitis there is no tenderness to palpation the surgical incisions are well-healed.  We will harvest the sutures today patient was given instructions for Achilles stretching.Does have 5th toe curly toe which is curled under the fourth toe but there is no ulcers no skin breakdown no cellulitis.  Imaging: No results found. No images are attached to the encounter.  Labs: Lab Results  Component Value Date   HGBA1C 5.3 04/12/2017     Lab Results  Component Value Date   ALBUMIN 3.5 07/16/2018    Body mass index is 39.47 kg/m.  Orders:  No orders of the defined types were placed in this encounter.  No orders of the defined types were placed in this encounter.    Procedures: No procedures performed   Clinical Data: No additional findings.  ROS:  All other systems negative, except as noted in the HPI. Review of Systems  Objective: Vital Signs: Ht 5\' 7"  (1.702 m)   Wt 252 lb (114.3 kg)   BMI 39.47 kg/m   Specialty Comments:  No specialty comments available.  PMFS History: Patient Active Problem List   Diagnosis Date Noted  . Cellulitis of fourth toe of right foot 07/10/2018  . Osteomyelitis of fourth toe of right foot ([place]) 07/10/2018  . Osteomyelitis of second toe of right foot ([place]) 07/10/2018  . Atrial fibrillation ([place]) 04/15/2017  . Class 3 severe obesity with body mass index (BMI) of 40.0 to 44.9 in adult [facility]) 04/15/2017  . Hx of sleep apnea 04/15/2017   Past Medical History:  Diagnosis Date  . Atrial fibrillation ([place]) 2016  . Hypertension 2016   no longer an issue per patient 07/15/2018  . Pneumonia 05/2018  . Sleep apnea 2016   Does not use CPAP  . Wears glasses    reading  . Wears partial dentures    upper    Family History  Problem Relation Age of Onset  . Diabetes Mother   . Heart disease Father   . Heart disease Brother   . Cancer Brother        lung  cancer    Past Surgical History:  Procedure Laterality Date  . AMPUTATION Right 07/16/2018   Procedure: AMPUTATION DIP (DISTAL INTERPHALANGEAL) JOINT 2ND AND 4TH TOE RIGHT FOOT;  Surgeon: Newt Minion, MD;  Location: [facility];  Service: Orthopedics;  Laterality: Right;  . COLONOSCOPY     Normal  . FOOT SURGERY Right    x 3 minor procedures  . HERNIA REPAIR    . KNEE SURGERY Bilateral   . toe removal Right    tip of middle toe   Social History   Occupational History  . Not on file  Tobacco Use  . Smoking status: Former Smoker    Types: Cigarettes, Pipe  . Smokeless tobacco: Never Used  . Tobacco comment: stop smoking 30 years ago  Substance and Sexual Activity  . Alcohol use: Yes    Frequency: Never    Comment: Rum 2-3 times per week  . Drug use: No  . Sexual activity: Not on file

## 2018-08-04 ENCOUNTER — Telehealth: Payer: Self-pay | Admitting: General Practice

## 2018-08-04 NOTE — Telephone Encounter (Signed)
Patient stated no longer seeing [facility] and to remove her from profile

## 2018-08-06 DIAGNOSIS — Z89421 Acquired absence of other right toe(s): Secondary | ICD-10-CM | POA: Diagnosis not present

## 2018-08-06 DIAGNOSIS — Z7982 Long term (current) use of aspirin: Secondary | ICD-10-CM | POA: Diagnosis not present

## 2018-08-07 ENCOUNTER — Ambulatory Visit (INDEPENDENT_AMBULATORY_CARE_PROVIDER_SITE_OTHER): Payer: Medicare PPO | Admitting: Orthopedic Surgery

## 2018-10-13 ENCOUNTER — Ambulatory Visit: Payer: Self-pay | Admitting: Orthopedic Surgery

## 2018-12-02 ENCOUNTER — Encounter: Payer: Self-pay | Admitting: Orthopedic Surgery

## 2018-12-02 ENCOUNTER — Ambulatory Visit (INDEPENDENT_AMBULATORY_CARE_PROVIDER_SITE_OTHER): Payer: Medicare PPO

## 2018-12-02 ENCOUNTER — Ambulatory Visit (INDEPENDENT_AMBULATORY_CARE_PROVIDER_SITE_OTHER): Payer: Medicare PPO | Admitting: Orthopedic Surgery

## 2018-12-02 VITALS — Ht 67.0 in | Wt 252.0 lb

## 2018-12-02 DIAGNOSIS — L97511 Non-pressure chronic ulcer of other part of right foot limited to breakdown of skin: Secondary | ICD-10-CM

## 2018-12-02 DIAGNOSIS — M79674 Pain in right toe(s): Secondary | ICD-10-CM

## 2018-12-02 MED ORDER — DOXYCYCLINE HYCLATE 100 MG PO TABS: 100.0000 mg | ORAL_TABLET | Freq: Two times a day (BID) | ORAL | 0 refills | Status: DC

## 2018-12-02 NOTE — Progress Notes (Signed)
Office Visit Note   Patient: Ricky Dorsey           Date of Birth: 1946/03/29           MRN: [id number] Visit Date: 12/02/2018              Requested by: No referring provider defined for this encounter. PCP: Patient, No Pcp Per  Chief Complaint  Patient presents with  . Right Foot - Pain, Follow-up      HPI: Patient is a 73 year old gentleman who presents complaining of painful redness and ulceration right foot third toe with swelling.  Assessment & Plan: Visit Diagnoses:  1. Toe pain, right   2. Toe ulcer, right, limited to breakdown of skin ([place])     Plan: We will start doxycycline continue with protective shoe wear reevaluate in 3 weeks.  Follow-Up Instructions: Return in about 3 weeks (around 12/23/2018).   Ortho Exam  Patient is alert, oriented, no adenopathy, well-dressed, normal affect, normal respiratory effort. Examination patient has a good dorsalis pedis pulse previous partial toe amputations have healed well he has swelling over the tip of the right third toe the ulcer does not probe to bone there is cellulitis.  Radiographs shows no osteomyelitis.  Will proceed with antibiotics to try to heal the cellulitis.  Imaging: Xr Toe 3rd Right  Result Date: 12/02/2018 2 view radiographs of the right third toe shows no bony abnormalities there is a soft tissue ulcer it does not extend down to bone.  No images are attached to the encounter.  Labs: Lab Results  Component Value Date   HGBA1C 5.3 04/12/2017     Lab Results  Component Value Date   ALBUMIN 3.5 07/16/2018    Lab Results  Component Value Date   MG 2.2 04/12/2017   Lab Results  Component Value Date   VD25OH 30 04/12/2017    No results found for: PREALBUMIN CBC EXTENDED Latest Ref Rng & Units 07/16/2018 09/17/2017 04/12/2017  WBC 4.0 - 10.5 K/uL 6.1 7.7 7.8  RBC 4.22 - 5.81 MIL/uL 5.22 5.05 5.50  HGB 13.0 - 17.0 g/dL 15.9 15.9 17.6(H)  HCT 39.0 - 52.0 % 49.1 46.3 50.0  PLT 150 - 400 K/uL 227  178 271  NEUTROABS 1,500 - 7,800 cells/uL - - 4,937  LYMPHSABS 850 - 3,900 cells/uL - - 1,700     Body mass index is 39.47 kg/m.  Orders:  Orders Placed This Encounter  Procedures  . XR Toe 3rd Right   Meds ordered this encounter  Medications  . doxycycline (VIBRA-TABS) 100 MG tablet    Sig: Take 1 tablet (100 mg total) by mouth 2 (two) times daily.    Dispense:  60 tablet    Refill:  0     Procedures: No procedures performed  Clinical Data: No additional findings.  ROS:  All other systems negative, except as noted in the HPI. Review of Systems  Objective: Vital Signs: Ht 5\' 7"  (1.702 m)   Wt 252 lb (114.3 kg)   BMI 39.47 kg/m   Specialty Comments:  No specialty comments available.  PMFS History: Patient Active Problem List   Diagnosis Date Noted  . Cellulitis of fourth toe of right foot 07/10/2018  . Osteomyelitis of fourth toe of right foot ([place]) 07/10/2018  . Osteomyelitis of second toe of right foot ([place]) 07/10/2018  . Atrial fibrillation ([place]) 04/15/2017  . Class 3 severe obesity with body mass index (BMI) of 40.0 to 44.9 in adult (  [place]) 04/15/2017  . Hx of sleep apnea 04/15/2017   Past Medical History:  Diagnosis Date  . Atrial fibrillation ([place]) 2016  . Hypertension 2016   no longer an issue per patient 07/15/2018  . Pneumonia 05/2018  . Sleep apnea 2016   Does not use CPAP  . Wears glasses    reading  . Wears partial dentures    upper    Family History  Problem Relation Age of Onset  . Diabetes Mother   . Heart disease Father   . Heart disease Brother   . Cancer Brother        lung cancer    Past Surgical History:  Procedure Laterality Date  . AMPUTATION Right 07/16/2018   Procedure: AMPUTATION DIP (DISTAL INTERPHALANGEAL) JOINT 2ND AND 4TH TOE RIGHT FOOT;  Surgeon: Newt Minion, MD;  Location: [facility];  Service: Orthopedics;  Laterality: Right;  . COLONOSCOPY     Normal  . FOOT SURGERY Right    x 3 minor procedures  . HERNIA REPAIR     . KNEE SURGERY Bilateral   . toe removal Right    tip of middle toe   Social History   Occupational History  . Not on file  Tobacco Use  . Smoking status: Former Smoker    Types: Cigarettes, Pipe  . Smokeless tobacco: Never Used  . Tobacco comment: stop smoking 30 years ago  Substance and Sexual Activity  . Alcohol use: Yes    Frequency: Never    Comment: Rum 2-3 times per week  . Drug use: No  . Sexual activity: Not on file

## 2018-12-23 ENCOUNTER — Encounter: Payer: Self-pay | Admitting: Orthopedic Surgery

## 2018-12-23 ENCOUNTER — Ambulatory Visit (INDEPENDENT_AMBULATORY_CARE_PROVIDER_SITE_OTHER): Payer: Medicare PPO | Admitting: Orthopedic Surgery

## 2018-12-23 VITALS — Ht 67.0 in | Wt 252.0 lb

## 2018-12-23 DIAGNOSIS — L97511 Non-pressure chronic ulcer of other part of right foot limited to breakdown of skin: Secondary | ICD-10-CM

## 2019-01-13 ENCOUNTER — Encounter: Payer: Self-pay | Admitting: Orthopedic Surgery

## 2019-01-13 NOTE — Progress Notes (Signed)
Office Visit Note   Patient: Ricky Dorsey           Date of Birth: Feb 08, 1946           MRN: [id number] Visit Date: 12/23/2018              Requested by: No referring provider defined for this encounter. PCP: Patient, No Pcp Per  Chief Complaint  Patient presents with  . Right 3rd Toe - Follow-up, Wound Check      HPI: Patient is a 73 year old gentleman presents in follow-up for cellulitis ulceration right foot third toe.  Patient is still on doxycycline he is wearing compression stockings he has Hoka sneakers.  Assessment & Plan: Visit Diagnoses:  1. Toe ulcer, right, limited to breakdown of skin [facility])     Plan: Patient will complete his course of antibiotics.  Follow-Up Instructions: Return if symptoms worsen or fail to improve.   Ortho Exam  Patient is alert, oriented, no adenopathy, well-dressed, normal affect, normal respiratory effort. Examination there is no cellulitis no swelling no open ulcers of the third toe at this time callus was pared no drainage no exposed tendon.  Imaging: No results found. No images are attached to the encounter.  Labs: Lab Results  Component Value Date   HGBA1C 5.3 04/12/2017     Lab Results  Component Value Date   ALBUMIN 3.5 07/16/2018    Lab Results  Component Value Date   MG 2.2 04/12/2017   Lab Results  Component Value Date   VD25OH 30 04/12/2017    No results found for: PREALBUMIN CBC EXTENDED Latest Ref Rng & Units 07/16/2018 09/17/2017 04/12/2017  WBC 4.0 - 10.5 K/uL 6.1 7.7 7.8  RBC 4.22 - 5.81 MIL/uL 5.22 5.05 5.50  HGB 13.0 - 17.0 g/dL 15.9 15.9 17.6(H)  HCT 39.0 - 52.0 % 49.1 46.3 50.0  PLT 150 - 400 K/uL 227 178 271  NEUTROABS 1,500 - 7,800 cells/uL - - 4,937  LYMPHSABS 850 - 3,900 cells/uL - - 1,700     Body mass index is 39.47 kg/m.  Orders:  No orders of the defined types were placed in this encounter.  No orders of the defined types were placed in this encounter.    Procedures: No  procedures performed  Clinical Data: No additional findings.  ROS:  All other systems negative, except as noted in the HPI. Review of Systems  Objective: Vital Signs: Ht 5\' 7"  (1.702 m)   Wt 252 lb (114.3 kg)   BMI 39.47 kg/m   Specialty Comments:  No specialty comments available.  PMFS History: Patient Active Problem List   Diagnosis Date Noted  . Cellulitis of fourth toe of right foot 07/10/2018  . Osteomyelitis of fourth toe of right foot ([place]) 07/10/2018  . Osteomyelitis of second toe of right foot ([place]) 07/10/2018  . Atrial fibrillation ([place]) 04/15/2017  . Class 3 severe obesity with body mass index (BMI) of 40.0 to 44.9 in adult [facility]) 04/15/2017  . Hx of sleep apnea 04/15/2017   Past Medical History:  Diagnosis Date  . Atrial fibrillation ([place]) 2016  . Hypertension 2016   no longer an issue per patient 07/15/2018  . Pneumonia 05/2018  . Sleep apnea 2016   Does not use CPAP  . Wears glasses    reading  . Wears partial dentures    upper    Family History  Problem Relation Age of Onset  . Diabetes Mother   . Heart disease Father   .  Heart disease Brother   . Cancer Brother        lung cancer    Past Surgical History:  Procedure Laterality Date  . AMPUTATION Right 07/16/2018   Procedure: AMPUTATION DIP (DISTAL INTERPHALANGEAL) JOINT 2ND AND 4TH TOE RIGHT FOOT;  Surgeon: Newt Minion, MD;  Location: [facility];  Service: Orthopedics;  Laterality: Right;  . COLONOSCOPY     Normal  . FOOT SURGERY Right    x 3 minor procedures  . HERNIA REPAIR    . KNEE SURGERY Bilateral   . toe removal Right    tip of middle toe   Social History   Occupational History  . Not on file  Tobacco Use  . Smoking status: Former Smoker    Types: Cigarettes, Pipe  . Smokeless tobacco: Never Used  . Tobacco comment: stop smoking 30 years ago  Substance and Sexual Activity  . Alcohol use: Yes    Frequency: Never    Comment: Rum 2-3 times per week  . Drug use: No  . Sexual  activity: Not on file

## 2019-04-27 DIAGNOSIS — Z6827 Body mass index (BMI) 27.0-27.9, adult: Secondary | ICD-10-CM | POA: Diagnosis not present

## 2019-04-27 DIAGNOSIS — H548 Legal blindness, as defined in USA: Secondary | ICD-10-CM | POA: Diagnosis not present

## 2019-04-27 DIAGNOSIS — Z96653 Presence of artificial knee joint, bilateral: Secondary | ICD-10-CM | POA: Diagnosis not present

## 2019-04-27 DIAGNOSIS — E669 Obesity, unspecified: Secondary | ICD-10-CM | POA: Diagnosis not present

## 2019-05-07 DIAGNOSIS — Z20828 Contact with and (suspected) exposure to other viral communicable diseases: Secondary | ICD-10-CM | POA: Diagnosis not present

## 2019-05-07 DIAGNOSIS — Z20822 Contact with and (suspected) exposure to covid-19: Secondary | ICD-10-CM | POA: Diagnosis not present

## 2019-06-15 ENCOUNTER — Ambulatory Visit: Payer: Medicare PPO | Admitting: Orthopedic Surgery

## 2019-06-16 ENCOUNTER — Ambulatory Visit: Payer: Medicare PPO | Admitting: Orthopedic Surgery

## 2019-06-16 ENCOUNTER — Encounter: Payer: Self-pay | Admitting: Orthopedic Surgery

## 2019-06-16 ENCOUNTER — Other Ambulatory Visit: Payer: Self-pay

## 2019-06-16 DIAGNOSIS — M722 Plantar fascial fibromatosis: Secondary | ICD-10-CM | POA: Diagnosis not present

## 2019-06-16 DIAGNOSIS — B351 Tinea unguium: Secondary | ICD-10-CM

## 2019-06-16 NOTE — Progress Notes (Signed)
Office Visit Note   Patient: Ricky Dorsey           Date of Birth: 20-May-1945           MRN: [id number] Visit Date: 06/16/2019              Requested by: No referring provider defined for this encounter. PCP: Patient, No Pcp Per  No chief complaint on file.     HPI: The patient is a 74 year old gentleman who states he is well-known to Dr. Sharol Given he presents today complaining of left foot pain.  He states that he has been having some start up in the morning pain of the plantar aspect of his left foot predominantly under the heel.  He states he does have a history of low arches this is been a lifelong problem for him.  He however states he has never had fasciitis he is concerned that he may have a plantar heel spur as well.  States he has tried using anti-inflammatories topicals he is rubbed a tennis ball on his foot with modest relief of his pain he has tried ice as well states that he does have some custom orthotics and custom shoes however these are all muddy due to all the rain and he has not been able to wear them states over the last several weeks he is worn some different tennis shoes wonders if this contributed to his pain  He also has thickened and discolored onychomycotic nails these are difficult and painful he is not able to trim them  Assessment & Plan: Visit Diagnoses:  1. Plantar fasciitis   2. Onychomycosis     Plan: Depo-Medrol injection left plantar fascia.  Discussed conservative measures.  Using ice as well as supportive shoe wear.  He will work on heel cord stretching.  Nails were trimmed without incident  Follow-Up Instructions: Return in about 4 weeks (around 07/14/2019), or if symptoms worsen or fail to improve.   Ortho Exam  Patient is alert, oriented, no adenopathy, well-dressed, normal affect, normal respiratory effort. On examination of left foot has no erythema or edema. Point tender to origin of plantar fascia. No pain with lateral compression of calcaneus.  Thickened and discolored onychomycotic nails. Patient is unable to safely trim his own nails. Nails trimmed x 10.  Imaging: No results found. No images are attached to the encounter.  Labs: Lab Results  Component Value Date   HGBA1C 5.3 04/12/2017     Lab Results  Component Value Date   ALBUMIN 3.5 07/16/2018    Lab Results  Component Value Date   MG 2.2 04/12/2017   Lab Results  Component Value Date   VD25OH 30 04/12/2017    No results found for: PREALBUMIN CBC EXTENDED Latest Ref Rng & Units 07/16/2018 09/17/2017 04/12/2017  WBC 4.0 - 10.5 K/uL 6.1 7.7 7.8  RBC 4.22 - 5.81 MIL/uL 5.22 5.05 5.50  HGB 13.0 - 17.0 g/dL 15.9 15.9 17.6(H)  HCT 39.0 - 52.0 % 49.1 46.3 50.0  PLT 150 - 400 K/uL 227 178 271  NEUTROABS 1,500 - 7,800 cells/uL - - 4,937  LYMPHSABS 850 - 3,900 cells/uL - - 1,700     There is no height or weight on file to calculate BMI.  Orders:  No orders of the defined types were placed in this encounter.  No orders of the defined types were placed in this encounter.    Procedures: No procedures performed  Clinical Data: No additional findings.  ROS:  All other systems negative, except as noted in the HPI. Review of Systems  Constitutional: Negative for chills and fever.  Musculoskeletal: Positive for gait problem and myalgias.  Skin: Negative for wound.    Objective: Vital Signs: There were no vitals taken for this visit.  Specialty Comments:  No specialty comments available.  PMFS History: Patient Active Problem List   Diagnosis Date Noted  . Cellulitis of fourth toe of right foot 07/10/2018  . Osteomyelitis of fourth toe of right foot ([place]) 07/10/2018  . Osteomyelitis of second toe of right foot ([place]) 07/10/2018  . Atrial fibrillation ([place]) 04/15/2017  . Class 3 severe obesity with body mass index (BMI) of 40.0 to 44.9 in adult [facility]) 04/15/2017  . Hx of sleep apnea 04/15/2017   Past Medical History:  Diagnosis Date  . Atrial  fibrillation ([place]) 2016  . Hypertension 2016   no longer an issue per patient 07/15/2018  . Pneumonia 05/2018  . Sleep apnea 2016   Does not use CPAP  . Wears glasses    reading  . Wears partial dentures    upper    Family History  Problem Relation Age of Onset  . Diabetes Mother   . Heart disease Father   . Heart disease Brother   . Cancer Brother        lung cancer    Past Surgical History:  Procedure Laterality Date  . AMPUTATION Right 07/16/2018   Procedure: AMPUTATION DIP (DISTAL INTERPHALANGEAL) JOINT 2ND AND 4TH TOE RIGHT FOOT;  Surgeon: Newt Minion, MD;  Location: [place];  Service: Orthopedics;  Laterality: Right;  . COLONOSCOPY     Normal  . FOOT SURGERY Right    x 3 minor procedures  . HERNIA REPAIR    . KNEE SURGERY Bilateral   . toe removal Right    tip of middle toe   Social History   Occupational History  . Not on file  Tobacco Use  . Smoking status: Former Smoker    Types: Cigarettes, Pipe  . Smokeless tobacco: Never Used  . Tobacco comment: stop smoking 30 years ago  Substance and Sexual Activity  . Alcohol use: Yes    Comment: Rum 2-3 times per week  . Drug use: No  . Sexual activity: Not on file

## 2019-08-26 DIAGNOSIS — Z125 Encounter for screening for malignant neoplasm of prostate: Secondary | ICD-10-CM | POA: Diagnosis not present

## 2019-08-26 DIAGNOSIS — N529 Male erectile dysfunction, unspecified: Secondary | ICD-10-CM | POA: Diagnosis not present

## 2019-08-26 DIAGNOSIS — R3915 Urgency of urination: Secondary | ICD-10-CM | POA: Diagnosis not present

## 2019-08-26 DIAGNOSIS — Z0000 Encounter for general adult medical examination without abnormal findings: Secondary | ICD-10-CM | POA: Diagnosis not present

## 2019-08-26 DIAGNOSIS — R358 Other polyuria: Secondary | ICD-10-CM | POA: Diagnosis not present

## 2019-10-26 DIAGNOSIS — Z0000 Encounter for general adult medical examination without abnormal findings: Secondary | ICD-10-CM | POA: Diagnosis not present

## 2020-01-22 IMAGING — DX DG CHEST 2V
2 series · 2 of 2 positions shown · non-contrast
Comparison: None.

CLINICAL DATA: Paroxysmal atrial fibrillation.

EXAM:
CHEST - 2 VIEW

[chest pa]
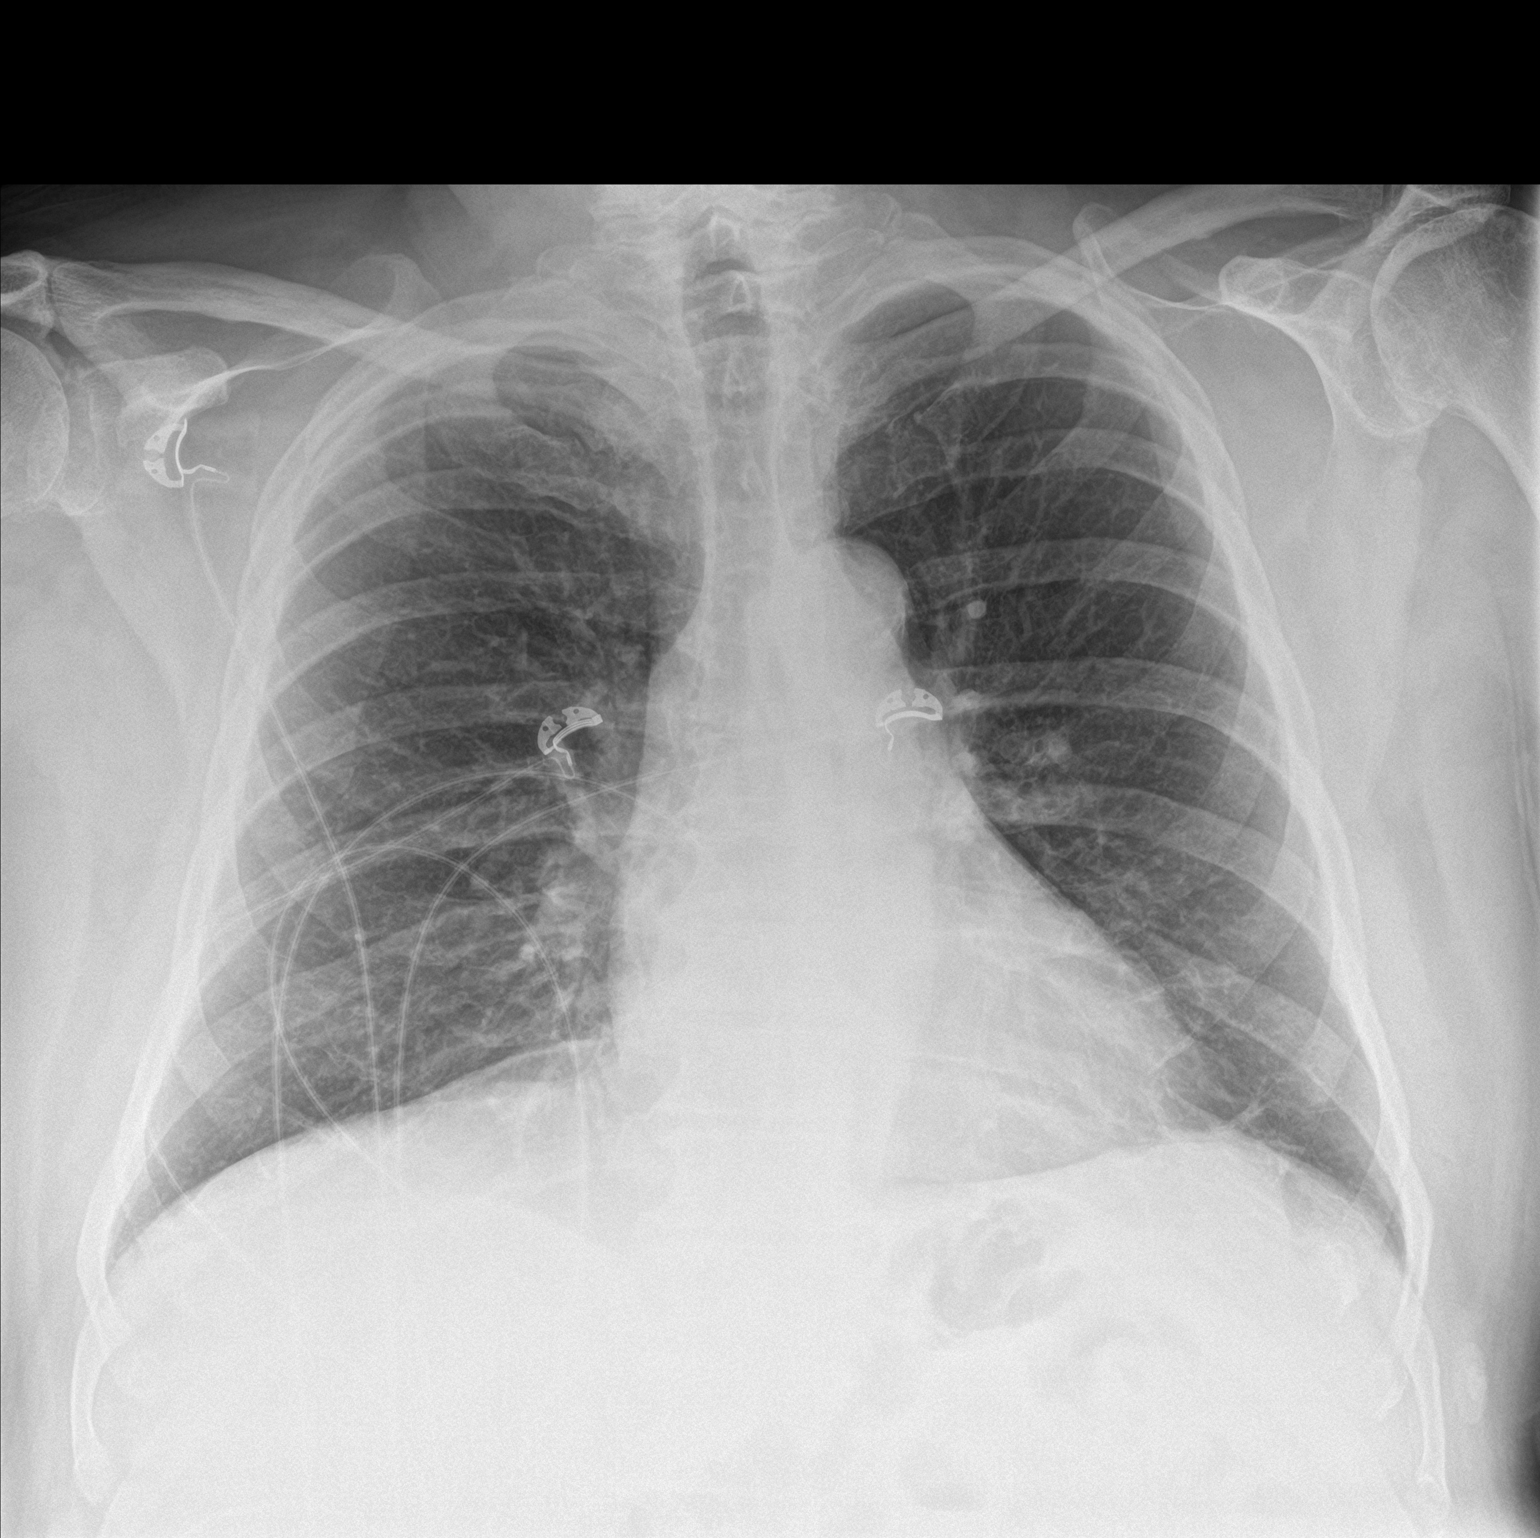

[chest lat]
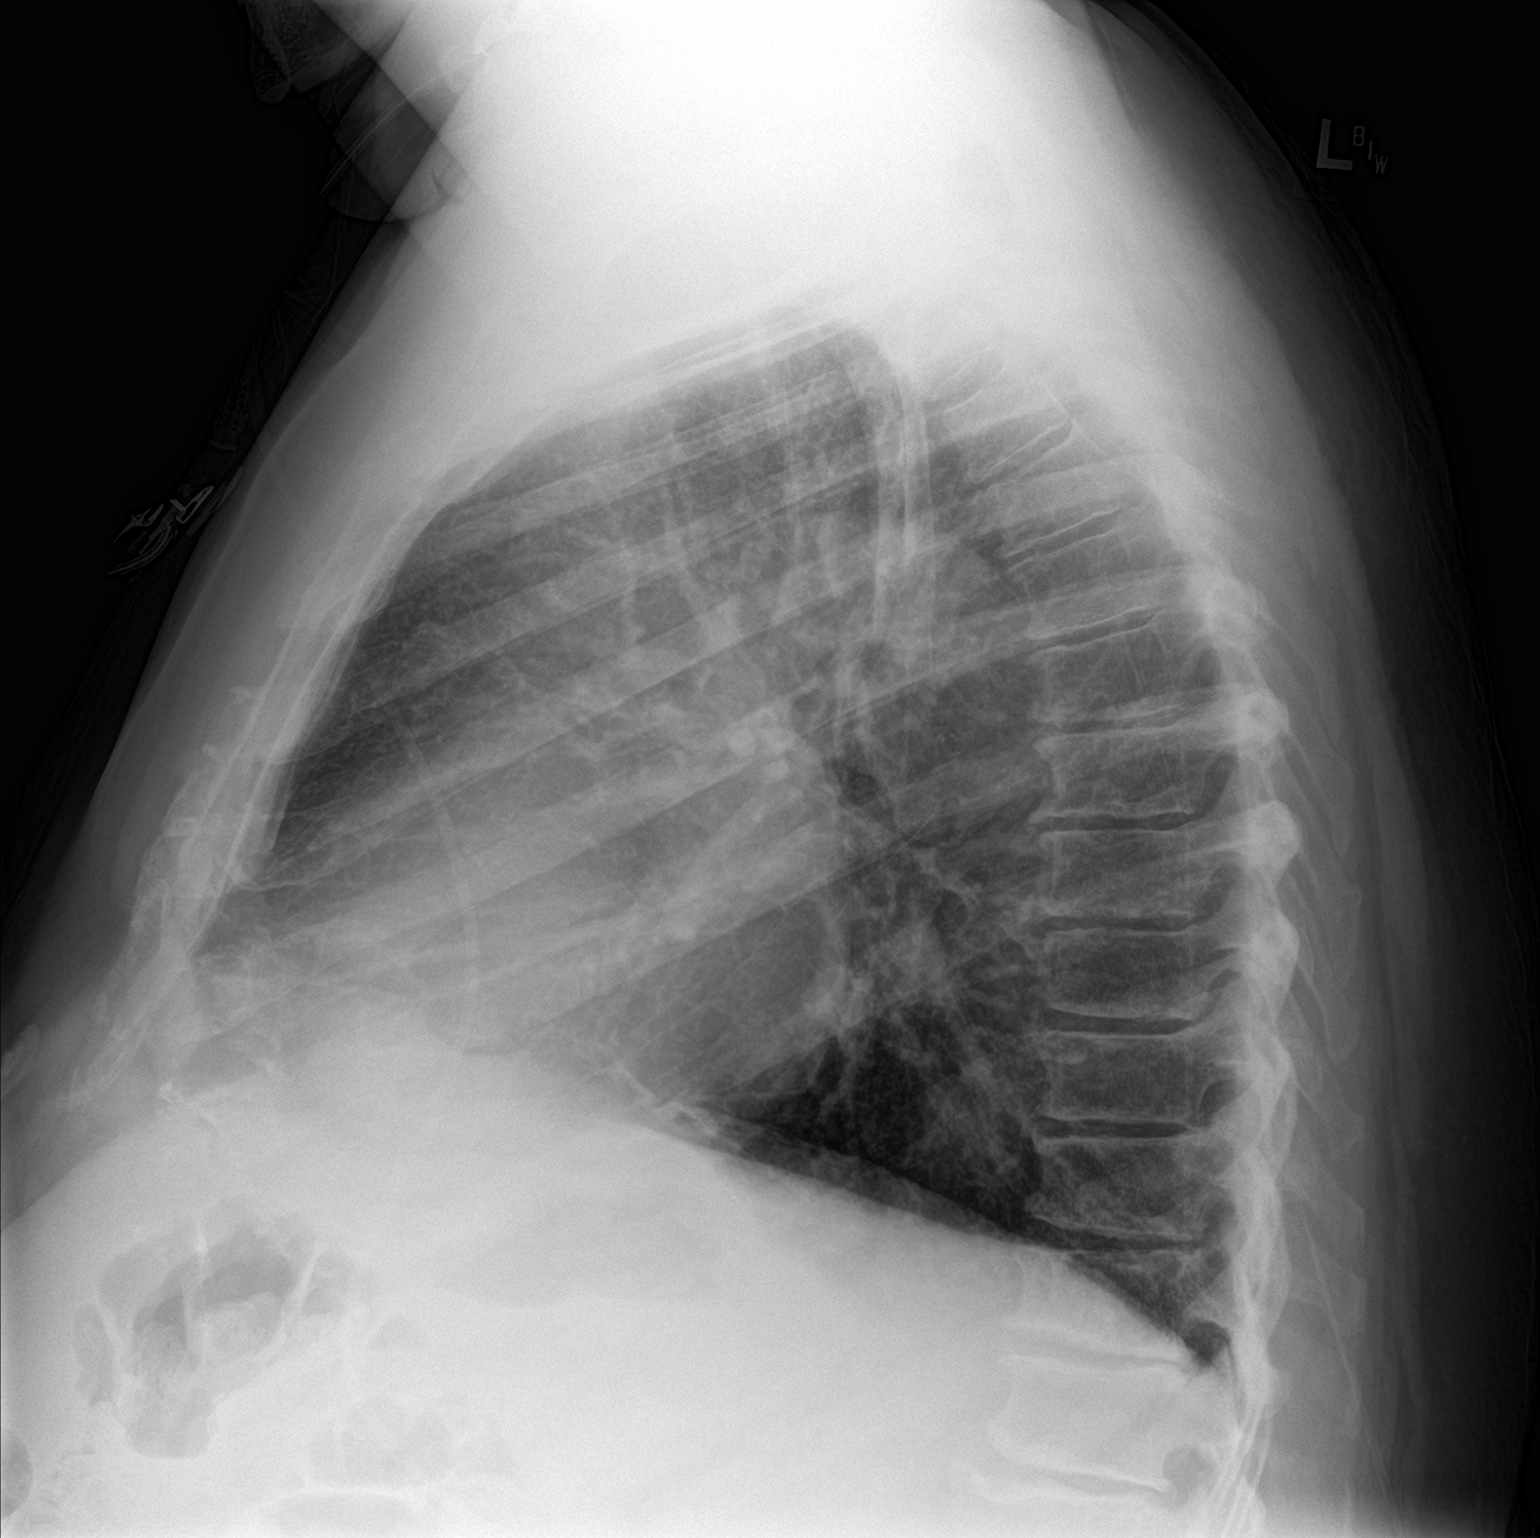

[2 of 2 positions shown; findings below may reference images not displayed]

FINDINGS: Cardiomediastinal silhouette is mildly enlarged. Calcific
atherosclerotic disease and tortuosity of the aorta. Mediastinal
contours appear intact.

There is no evidence of focal airspace consolidation, pleural
effusion or pneumothorax.

Osseous structures are without acute abnormality. Soft tissues are
grossly normal.
IMPRESSION: No active cardiopulmonary disease.

Calcific atherosclerotic disease and tortuosity of the aorta.

Mildly enlarged cardiac silhouette.

## 2020-04-04 LAB — COLOGUARD: Cologuard: POSITIVE — AB

## 2020-05-09 ENCOUNTER — Encounter (INDEPENDENT_AMBULATORY_CARE_PROVIDER_SITE_OTHER): Payer: Self-pay

## 2020-05-09 ENCOUNTER — Encounter: Payer: Self-pay | Admitting: Physician Assistant

## 2020-05-23 DIAGNOSIS — R195 Other fecal abnormalities: Secondary | ICD-10-CM

## 2020-05-24 ENCOUNTER — Encounter: Payer: Self-pay | Admitting: Physician Assistant

## 2020-05-24 ENCOUNTER — Ambulatory Visit: Payer: Medicare PPO | Admitting: Physician Assistant

## 2020-05-24 VITALS — BP 130/80 | HR 76 | Ht 67.0 in | Wt 258.0 lb

## 2020-05-24 DIAGNOSIS — R195 Other fecal abnormalities: Secondary | ICD-10-CM

## 2020-05-24 MED ORDER — NA SULFATE-K SULFATE-MG SULF 17.5-3.13-1.6 GM/177ML PO SOLN: 1.0000 | Freq: Once | ORAL | 0 refills | Status: DC

## 2020-05-24 MED ORDER — NA SULFATE-K SULFATE-MG SULF 17.5-3.13-1.6 GM/177ML PO SOLN: 1.0000 | Freq: Once | ORAL | 0 refills | Status: AC

## 2020-05-24 NOTE — Patient Instructions (Signed)
If you are age 73 or older, your body mass index should be between 23-30. Your Body mass index is 40.41 kg/m. If this is out of the aforementioned range listed, please consider follow up with your Primary Care Provider.  If you are age 57 or younger, your body mass index should be between 19-25. Your Body mass index is 40.41 kg/m. If this is out of the aformentioned range listed, please consider follow up with your Primary Care Provider.   You have been scheduled for a colonoscopy. Please follow written instructions given to you at your visit today.  Please pick up your prep supplies at the pharmacy within the next 1-3 days. If you use inhalers (even only as needed), please bring them with you on the day of your procedure.  Follow up pending the results of your Colonoscopy.  Thank you for entrusting me with your care and choosing [facility].  Amy Esterwood, PA-C

## 2020-05-24 NOTE — Progress Notes (Signed)
Subjective:    Patient ID: Ricky Dorsey, male    DOB: April 03, 1946, 75 y.o.   MRN: [id number]  HPI Galileo is a pleasant 75 year old white male, new to GI today referred by Dr. Danise Mina with positive Cologuard. Patient says that he did have 1 prior colonoscopy greater than 20 years ago and may have had some polyps removed. Family history is negative for colon cancer and polyps as far as he is aware. Patient has no current GI symptoms, denies any problems with heartburn , indigestion, no dysphagia, no complaints of abdominal pain changes in bowel habits melena or hematochezia. He does have history of atrial fibrillation, is maintained just on baby aspirin, hypertension, sleep Apnea, no CPAP use and obesity with BMI of 40.  Echo done in 2018 with EF 60 to 65%. COVID-19 vaccination completed  Review of Systems Pertinent positive and negative review of systems were noted in the above HPI section.  All other review of systems was otherwise negative.  Outpatient Encounter Medications as of 05/24/2020  Medication Sig  . aspirin EC 81 MG tablet Take 81 mg by mouth at bedtime.  . divalproex (DEPAKOTE SPRINKLE) 125 MG capsule Take 125 mg by mouth 3 (three) times daily as needed.  . Magnesium 500 MG TABS Take 500 mg by mouth daily.  . Multiple Vitamin (MULTIVITAMIN WITH MINERALS) TABS tablet Take 1 tablet by mouth daily.  . Na Sulfate-K Sulfate-Mg Sulf 17.5-3.13-1.6 GM/177ML SOLN Take 1 kit by mouth once for 1 dose. Apply Coupon=BIN: [id number] PCN: CN GROUP: [id number] ID: [id number]; NO prior authorization  . QUEtiapine (SEROQUEL) 25 MG tablet Take 25 mg by mouth at bedtime.  . [DISCONTINUED] Na Sulfate-K Sulfate-Mg Sulf 17.5-3.13-1.6 GM/177ML SOLN Take 1 kit by mouth once for 1 dose. Apply Coupon=BIN: [id number] PCN: CN GROUP: [id number] ID: [id number]; NO prior authorization  . [DISCONTINUED] Magnesium 200 MG TABS Take 200 mg by mouth at bedtime.  . [DISCONTINUED] NON FORMULARY Apply 1 application topically  See admin instructions. "Soothing Blend" Essential Oil(s): Apply topically as needed to provide comfort   No facility-administered encounter medications on file as of 05/24/2020.   Allergies  Allergen Reactions  . Azithromycin Swelling, Rash and Other (See Comments)    Leg swelling with rash   Patient Active Problem List   Diagnosis Date Noted  . Positive colorectal cancer screening using Cologuard test 05/23/2020  . Cellulitis of fourth toe of right foot 07/10/2018  . Osteomyelitis of fourth toe of right foot ([place]) 07/10/2018  . Osteomyelitis of second toe of right foot ([place]) 07/10/2018  . Atrial fibrillation ([place]) 04/15/2017  . Class 3 severe obesity with body mass index (BMI) of 40.0 to 44.9 in adult [facility]) 04/15/2017  . Hx of sleep apnea 04/15/2017  . HTN (hypertension) 01/24/2016  . Hyperlipidemia 08/14/2014  . Obstructive sleep apnea 06/17/2012   Social History   Socioeconomic History  . Marital status: Married    Spouse name: Not on file  . Number of children: 5  . Years of education: Not on file  . Highest education level: Not on file  Occupational History  . Occupation: retired  Tobacco Use  . Smoking status: Former Smoker    Types: Cigarettes, Pipe  . Smokeless tobacco: Never Used  . Tobacco comment: stop smoking 30 years ago  Vaping Use  . Vaping Use: Never used  Substance and Sexual Activity  . Alcohol use: Yes    Comment: Rum 2-3 times per every 2 weeks  . Drug use:  No  . Sexual activity: Not on file  Other Topics Concern  . Not on file  Social History Narrative  . Not on file   Social Determinants of Health   Financial Resource Strain: Not on file  Food Insecurity: Not on file  Transportation Needs: Not on file  Physical Activity: Not on file  Stress: Not on file  Social Connections: Not on file  Intimate Partner Violence: Not on file    Mr. Glassberg's family history includes Cancer in his brother; Diabetes in his mother; Heart disease in his  brother and father.      Objective:    Vitals:   05/24/20 1413  BP: 130/80  Pulse: 76    Physical Exam Well-developed well-nourished older white male in no acute distress.  Height, Weight, BMI 40.4  HEENT; nontraumatic normocephalic, EOMI, PE R LA, sclera anicteric. Oropharynx; not examined today Neck; supple, no JVD Cardiovascular; regular rate and rhythm with S1-S2, no murmur rub or gallop Pulmonary; Clear bilaterally, somewhat decreased breath sounds Abdomen; soft, obese, nontender, nondistended, no palpable mass or hepatosplenomegaly, bowel sounds are active Rectal; not done today Skin; benign exam, no jaundice rash or appreciable lesions Extremities; no clubbing cyanosis or edema skin warm and dry Neuro/Psych; alert and oriented x4, grossly nonfocal mood and affect appropriate       Assessment & Plan:   #17 75 year old white male referred with positive Cologuard  remote colonoscopy greater than 20 years ago, possible polyps removed Patient is asymptomatic  Rule out adenomatous colon polyps, rule out neoplasm  #2 obesity-BMI 40 #3 hypertension #4.  Sleep apnea no CPAP use #5.  Atrial fibrillation only on baby aspirin #6 decreased visual acuity  Plan; patient will be scheduled for Colonoscopy with Dr. Bryan Lemma.  Procedure was discussed in detail with patient including indications risks and benefits and he is agreeable to proceed. Patient has completed COVID-19 vaccination.    Ranard Harte S Deondrea Markos PA-C 05/24/2020   Cc: No ref. provider found

## 2020-05-25 ENCOUNTER — Telehealth: Payer: Self-pay | Admitting: Physician Assistant

## 2020-05-25 NOTE — Telephone Encounter (Signed)
Patient called to ask if he can get the same prep medication as his wife.

## 2020-05-27 NOTE — Telephone Encounter (Signed)
Called and spoke with patient's wife Verdis Frederickson, to advise that the patient could be switched to Miralax and that I would get him new directions put in the mail.

## 2020-05-27 NOTE — Progress Notes (Signed)
Agree with the assessment and plan as outlined by Amy Esterwood, PA-C.  Tifany Hirsch, DO, FACG  

## 2020-05-27 NOTE — Telephone Encounter (Addendum)
Inbound call from patient's wife, Verdis Frederickson checking on status on prep medication change.  Request if she can be called at [phone].

## 2020-06-22 ENCOUNTER — Encounter: Payer: Self-pay | Admitting: Gastroenterology

## 2020-06-24 ENCOUNTER — Other Ambulatory Visit: Payer: Self-pay

## 2020-06-24 ENCOUNTER — Encounter: Payer: Self-pay | Admitting: Gastroenterology

## 2020-06-24 ENCOUNTER — Ambulatory Visit (AMBULATORY_SURGERY_CENTER): Payer: Medicare PPO | Admitting: Gastroenterology

## 2020-06-24 ENCOUNTER — Other Ambulatory Visit: Payer: Self-pay | Admitting: Gastroenterology

## 2020-06-24 VITALS — BP 104/83 | HR 85 | Temp 97.7°F | Resp 20 | Ht 67.0 in | Wt 258.0 lb

## 2020-06-24 DIAGNOSIS — K621 Rectal polyp: Secondary | ICD-10-CM | POA: Diagnosis not present

## 2020-06-24 DIAGNOSIS — D123 Benign neoplasm of transverse colon: Secondary | ICD-10-CM | POA: Diagnosis not present

## 2020-06-24 DIAGNOSIS — R195 Other fecal abnormalities: Secondary | ICD-10-CM | POA: Diagnosis not present

## 2020-06-24 DIAGNOSIS — D125 Benign neoplasm of sigmoid colon: Secondary | ICD-10-CM

## 2020-06-24 DIAGNOSIS — D128 Benign neoplasm of rectum: Secondary | ICD-10-CM

## 2020-06-24 DIAGNOSIS — D129 Benign neoplasm of anus and anal canal: Secondary | ICD-10-CM

## 2020-06-24 DIAGNOSIS — D124 Benign neoplasm of descending colon: Secondary | ICD-10-CM | POA: Diagnosis not present

## 2020-06-24 MED ORDER — SODIUM CHLORIDE 0.9 % IV SOLN: 500.0000 mL | Freq: Once | INTRAVENOUS | Status: DC

## 2020-06-24 NOTE — Op Note (Signed)
[facility] Patient Name: Ricky Dorsey Procedure Date: 06/24/2020 2:25 PM MRN: [id number] Endoscopist: Gerrit Heck , MD Age: 75 Referring MD:  Date of Birth: 12/16/1945 Gender: Male Account #: [id number] Procedure:                Colonoscopy Indications:              Positive Cologuard test Medicines:                Monitored Anesthesia Care Procedure:                Pre-Anesthesia Assessment:                           - Prior to the procedure, a History and Physical                            was performed, and patient medications and                            allergies were reviewed. The patient's tolerance of                            previous anesthesia was also reviewed. The risks                            and benefits of the procedure and the sedation                            options and risks were discussed with the patient.                            All questions were answered, and informed consent                            was obtained. Prior Anticoagulants: The patient has                            taken no previous anticoagulant or antiplatelet                            agents. ASA Grade Assessment: III - A patient with                            severe systemic disease. After reviewing the risks                            and benefits, the patient was deemed in                            satisfactory condition to undergo the procedure.                           After obtaining informed consent, the colonoscope  was passed under direct vision. Throughout the                            procedure, the patient's blood pressure, pulse, and                            oxygen saturations were monitored continuously. The                            Olympus [id number]) [id number] was introduced                            through the anus and advanced to the the cecum,                            identified by appendiceal orifice  and ileocecal                            valve. The colonoscopy was technically difficult                            and complex due to inadequate bowel prep. The                            patient tolerated the procedure well. The quality                            of the bowel preparation was fair. The ileocecal                            valve, appendiceal orifice, and rectum were                            photographed. Scope In: 2:34:15 PM Scope Out: 2:57:40 PM Scope Withdrawal Time: 0 hours 21 minutes 12 seconds  Total Procedure Duration: 0 hours 23 minutes 25 seconds  Findings:                 The perianal and digital rectal examinations were                            normal.                           Five sessile polyps were found in the sigmoid colon                            (1), descending colon (1), and transverse colon                            (3). The polyps were 2 to 5 mm in size. These                            polyps were removed with a cold snare. Resection  and retrieval were complete. Estimated blood loss                            was minimal.                           A 3 mm polyp was found in the rectum. The polyp was                            sessile. The polyp was removed with a cold snare.                            Resection and retrieval were complete. Estimated                            blood loss was minimal.                           A moderate amount of semi-liquid stool and solid                            food debris was found scattered throughout the                            entire colon, interfering with visualization.                            Lavage of the area was performed using copious                            amounts of tap water, resulting in incomplete                            clearance with fair visualization.                           The retroflexed view of the distal rectum and anal                             verge was normal and showed no anal or rectal                            abnormalities. Complications:            No immediate complications. Estimated Blood Loss:     Estimated blood loss was minimal. Impression:               - Preparation of the colon was fair. There was                            semi-liquid stool and solid food debris scattered                            throughout the entire colon. Despite copious  irrigation, the colon could not be completely                            visualized.                           - Five 2 to 5 mm polyps in the sigmoid colon, in                            the descending colon and in the transverse colon,                            removed with a cold snare. Resected and retrieved.                           - One 3 mm polyp in the rectum, removed with a cold                            snare. Resected and retrieved.                           - Stool in the entire examined colon.                           - The distal rectum and anal verge are normal on                            retroflexion view. Recommendation:           - Patient has a contact number available for                            emergencies. The signs and symptoms of potential                            delayed complications were discussed with the                            patient. Return to normal activities tomorrow.                            Written discharge instructions were provided to the                            patient.                           - Resume previous diet.                           - Continue present medications.                           - Await pathology results.                           -  Repeat colonoscopy in 6-12 months because the                            bowel preparation was suboptimal and for                            surveillance.                           - Return to GI office PRN. Gerrit Heck,  MD 06/24/2020 3:04:45 PM

## 2020-06-24 NOTE — Progress Notes (Signed)
No problems noted in the recovery room. maw 

## 2020-06-24 NOTE — Progress Notes (Signed)
Called to room to assist during endoscopic procedure.  Patient ID and intended procedure confirmed with present staff. Received instructions for my participation in the procedure from the performing physician.  

## 2020-06-24 NOTE — Progress Notes (Signed)
Vitals-SF  History reviewed. 

## 2020-06-24 NOTE — Progress Notes (Signed)
Report to PACU, RN, vss, BBS= Clear.  

## 2020-06-24 NOTE — Patient Instructions (Addendum)
Handout was given to your care partner on polyps. You may resume your current medications today. Await biopsy results.  May take 1-3 weeks to receive pathology results. Repeat colonoscopy in 6-12 months due to the bowel prep was suboptimal. Please call if any questions or concerns.     YOU HAD AN ENDOSCOPIC PROCEDURE TODAY AT [facility]:   Refer to the procedure report that was given to you for any specific questions about what was found during the examination.  If the procedure report does not answer your questions, please call your gastroenterologist to clarify.  If you requested that your care partner not be given the details of your procedure findings, then the procedure report has been included in a sealed envelope for you to review at your convenience later.  YOU SHOULD EXPECT: Some feelings of bloating in the abdomen. Passage of more gas than usual.  Walking can help get rid of the air that was put into your GI tract during the procedure and reduce the bloating. If you had a lower endoscopy (such as a colonoscopy or flexible sigmoidoscopy) you may notice spotting of blood in your stool or on the toilet paper. If you underwent a bowel prep for your procedure, you may not have a normal bowel movement for a few days.  Please Note:  You might notice some irritation and congestion in your nose or some drainage.  This is from the oxygen used during your procedure.  There is no need for concern and it should clear up in a day or so.  SYMPTOMS TO REPORT IMMEDIATELY:   Following lower endoscopy (colonoscopy or flexible sigmoidoscopy):  Excessive amounts of blood in the stool  Significant tenderness or worsening of abdominal pains  Swelling of the abdomen that is new, acute  Fever of 100F or higher  For urgent or emergent issues, a gastroenterologist can be reached at any hour by calling [phone]. Do not use MyChart messaging for urgent concerns.    DIET:  We do  recommend a small meal at first, but then you may proceed to your regular diet.  Drink plenty of fluids but you should avoid alcoholic beverages for 24 hours.  ACTIVITY:  You should plan to take it easy for the rest of today and you should NOT DRIVE or use heavy machinery until tomorrow (because of the sedation medicines used during the test).    FOLLOW UP: Our staff will call the number listed on your records 48-72 hours following your procedure to check on you and address any questions or concerns that you may have regarding the information given to you following your procedure. If we do not reach you, we will leave a message.  We will attempt to reach you two times.  During this call, we will ask if you have developed any symptoms of COVID 19. If you develop any symptoms (ie: fever, flu-like symptoms, shortness of breath, cough etc.) before then, please call [phone].  If you test positive for Covid 19 in the 2 weeks post procedure, please call and report this information to [place].    If any biopsies were taken you will be contacted by phone or by letter within the next 1-3 weeks.  Please call us at [phone] if you have not heard about the biopsies in 3 weeks.    SIGNATURES/CONFIDENTIALITY: You and/or your care partner have signed paperwork which will be entered into your electronic medical record.  These signatures attest to the fact  that that the information above on your After Visit Summary has been reviewed and is understood.  Full responsibility of the confidentiality of this discharge information lies with you and/or your care-partner.

## 2020-06-29 ENCOUNTER — Telehealth: Payer: Self-pay

## 2020-06-29 ENCOUNTER — Telehealth: Payer: Self-pay | Admitting: *Deleted

## 2020-06-29 NOTE — Telephone Encounter (Signed)
  Follow up Call-  Call back number 06/24/2020  Post procedure Call Back phone  # [phone]  Permission to leave phone message Yes  Some recent data might be hidden     Patient questions:  Do you have a fever, pain , or abdominal swelling? No. Pain Score  0 *  Have you tolerated food without any problems? Yes.    Have you been able to return to your normal activities? Yes.    Do you have any questions about your discharge instructions: Diet   No. Medications  No. Follow up visit  No.  Do you have questions or concerns about your Care? No.  Actions: * If pain score is 4 or above: No action needed, pain <4.  1. Have you developed a fever since your procedure? no  2.   Have you had an respiratory symptoms (SOB or cough) since your procedure? no  3.   Have you tested positive for COVID 19 since your procedure no  4.   Have you had any family members/close contacts diagnosed with the COVID 19 since your procedure?  no   If yes to any of these questions please route to Joylene John, RN and Joella Prince, RN

## 2020-06-29 NOTE — Telephone Encounter (Signed)
First post procedure follow up call, no answer 

## 2020-07-07 ENCOUNTER — Encounter: Payer: Self-pay | Admitting: Gastroenterology

## 2021-08-03 DIAGNOSIS — E559 Vitamin D deficiency, unspecified: Secondary | ICD-10-CM | POA: Diagnosis not present

## 2021-08-03 DIAGNOSIS — D7589 Other specified diseases of blood and blood-forming organs: Secondary | ICD-10-CM | POA: Diagnosis not present

## 2021-08-03 DIAGNOSIS — F319 Bipolar disorder, unspecified: Secondary | ICD-10-CM | POA: Diagnosis not present

## 2021-08-03 DIAGNOSIS — I10 Essential (primary) hypertension: Secondary | ICD-10-CM | POA: Diagnosis not present

## 2021-08-03 DIAGNOSIS — F411 Generalized anxiety disorder: Secondary | ICD-10-CM | POA: Diagnosis not present

## 2021-08-03 DIAGNOSIS — N3281 Overactive bladder: Secondary | ICD-10-CM | POA: Diagnosis not present

## 2021-08-03 DIAGNOSIS — G4733 Obstructive sleep apnea (adult) (pediatric): Secondary | ICD-10-CM | POA: Diagnosis not present

## 2021-08-03 DIAGNOSIS — Z0001 Encounter for general adult medical examination with abnormal findings: Secondary | ICD-10-CM | POA: Diagnosis not present

## 2021-09-04 ENCOUNTER — Encounter: Payer: Self-pay | Admitting: Gastroenterology

## 2021-09-04 DIAGNOSIS — H903 Sensorineural hearing loss, bilateral: Secondary | ICD-10-CM | POA: Diagnosis not present

## 2021-09-20 DIAGNOSIS — H903 Sensorineural hearing loss, bilateral: Secondary | ICD-10-CM | POA: Diagnosis not present

## 2021-10-26 DIAGNOSIS — G4733 Obstructive sleep apnea (adult) (pediatric): Secondary | ICD-10-CM | POA: Diagnosis not present

## 2021-10-26 DIAGNOSIS — F319 Bipolar disorder, unspecified: Secondary | ICD-10-CM | POA: Diagnosis not present

## 2021-10-26 DIAGNOSIS — I10 Essential (primary) hypertension: Secondary | ICD-10-CM | POA: Diagnosis not present

## 2021-10-26 DIAGNOSIS — I878 Other specified disorders of veins: Secondary | ICD-10-CM | POA: Diagnosis not present

## 2021-11-20 DIAGNOSIS — L728 Other follicular cysts of the skin and subcutaneous tissue: Secondary | ICD-10-CM | POA: Diagnosis not present

## 2021-11-20 DIAGNOSIS — L821 Other seborrheic keratosis: Secondary | ICD-10-CM | POA: Diagnosis not present

## 2021-12-15 ENCOUNTER — Ambulatory Visit (INDEPENDENT_AMBULATORY_CARE_PROVIDER_SITE_OTHER): Payer: No Typology Code available for payment source | Admitting: Family

## 2021-12-15 DIAGNOSIS — M76822 Posterior tibial tendinitis, left leg: Secondary | ICD-10-CM

## 2021-12-20 NOTE — Progress Notes (Signed)
Office Visit Note   Patient: Ricky Dorsey           Date of Birth: [birth year]           MRN: [id number] Visit Date: 12/15/2021              Requested by: No referring provider defined for this encounter. PCP: Patient, No Pcp Per  Chief Complaint  Patient presents with   Left Foot - Pain      HPI: The patient is a 76 year old gentleman with a with a several month history of left foot and ankle pain this is primarily over the medial ankle and foot.  Made worse by weightbearing gradually worsens the longer that he bears weight.  Did not have a sudden onset has been gradually worsening no improvement with anti-inflammatories does endorse feeling better when he is wearing supportive shoe wear  Assessment & Plan: Visit Diagnoses:  1. Posterior tibial tendinitis, left leg   2. Insufficiency of left posterior tibial tendon     Plan: Posterior tibial tendinitis on the left.  Given a PTTD brace.  Also provided some padding to the supportive arch shows the brace becomes too uncomfortable for him he will use 600 mg of ibuprofen 3 times a day follow-up in office in 3 to 4 weeks  Follow-Up Instructions: No follow-ups on file.   Ortho Exam  Patient is alert, oriented, no adenopathy, well-dressed, normal affect, normal respiratory effort. On examination of the left foot and ankle there is minimal edema however he is point tender along the course of posterior tibial tendon, most at insertion.  Unable to do single limb heel raise  Imaging: No results found. No images are attached to the encounter.  Labs: Lab Results  Component Value Date   HGBA1C 5.3 04/12/2017     Lab Results  Component Value Date   ALBUMIN 3.5 07/16/2018    Lab Results  Component Value Date   MG 2.2 04/12/2017   Lab Results  Component Value Date   VD25OH 30 04/12/2017    No results found for: "PREALBUMIN"    Latest Ref Rng & Units 07/16/2018    7:11 AM 09/17/2017    1:21 PM 04/12/2017    4:51 PM  CBC  EXTENDED  WBC 4.0 - 10.5 K/uL 6.1  7.7  7.8   RBC 4.22 - 5.81 MIL/uL 5.22  5.05  5.50   Hemoglobin 13.0 - 17.0 g/dL 15.9  15.9  17.6   HCT 39.0 - 52.0 % 49.1  46.3  50.0   Platelets 150 - 400 K/uL 227  178  271   NEUT# 1,500 - 7,800 cells/uL   4,937   Lymph# 850 - 3,900 cells/uL   1,700      There is no height or weight on file to calculate BMI.  Orders:  No orders of the defined types were placed in this encounter.  No orders of the defined types were placed in this encounter.    Procedures: No procedures performed  Clinical Data: No additional findings.  ROS:  All other systems negative, except as noted in the HPI. Review of Systems  Constitutional: Negative.   Musculoskeletal:  Positive for arthralgias, gait problem and myalgias.    Objective: Vital Signs: There were no vitals taken for this visit.  Specialty Comments:  No specialty comments available.  PMFS History: Patient Active Problem List   Diagnosis Date Noted   Positive colorectal cancer screening using Cologuard test 05/23/2020   Cellulitis  of fourth toe of right foot 07/10/2018   Osteomyelitis of fourth toe of right foot ([place]) 07/10/2018   Osteomyelitis of second toe of right foot ([place]) 07/10/2018   Atrial fibrillation ([place]) 04/15/2017   Class 3 severe obesity with body mass index (BMI) of 40.0 to 44.9 in adult ([place]) 04/15/2017   Hx of sleep apnea 04/15/2017   HTN (hypertension) 01/24/2016   Hyperlipidemia 08/14/2014   Obstructive sleep apnea 06/17/2012   Past Medical History:  Diagnosis Date   Atrial fibrillation ([place]) 2016   Hypertension 2016   no longer an issue per patient 07/15/2018   Pneumonia 05/2018   Sleep apnea 2016   Does not use CPAP   Wears glasses    reading   Wears partial dentures    upper    Family History  Problem Relation Age of Onset   Diabetes Mother    Heart disease Father    Heart disease Brother    Cancer Brother        lung cancer    Past Surgical History:   Procedure Laterality Date   AMPUTATION Right 07/16/2018   Procedure: AMPUTATION DIP (DISTAL INTERPHALANGEAL) JOINT 2ND AND 4TH TOE RIGHT FOOT;  Surgeon: Newt Minion, MD;  Location: [facility];  Service: Orthopedics;  Laterality: Right;   COLONOSCOPY     Normal   HERNIA REPAIR     umbilical   KNEE SURGERY Bilateral    toe removal Right    tip of middle toe, trimmed 3 toes   Social History   Occupational History   Occupation: retired  Tobacco Use   Smoking status: Former    Types: Cigarettes, Pipe   Smokeless tobacco: Never   Tobacco comments:    stop smoking 30 years ago  Scientific laboratory technician Use: Never used  Substance and Sexual Activity   Alcohol use: Yes    Comment: Rum 2-3 times per every 2 weeks   Drug use: No   Sexual activity: Not on file

## 2021-12-27 DIAGNOSIS — I10 Essential (primary) hypertension: Secondary | ICD-10-CM | POA: Diagnosis not present

## 2021-12-27 DIAGNOSIS — I771 Stricture of artery: Secondary | ICD-10-CM | POA: Diagnosis not present

## 2021-12-27 DIAGNOSIS — I4891 Unspecified atrial fibrillation: Secondary | ICD-10-CM | POA: Diagnosis not present

## 2021-12-27 DIAGNOSIS — N400 Enlarged prostate without lower urinary tract symptoms: Secondary | ICD-10-CM | POA: Diagnosis not present

## 2021-12-27 DIAGNOSIS — G4733 Obstructive sleep apnea (adult) (pediatric): Secondary | ICD-10-CM | POA: Diagnosis not present

## 2021-12-27 DIAGNOSIS — Z008 Encounter for other general examination: Secondary | ICD-10-CM | POA: Diagnosis not present

## 2021-12-27 DIAGNOSIS — D6869 Other thrombophilia: Secondary | ICD-10-CM | POA: Diagnosis not present

## 2021-12-27 DIAGNOSIS — M1990 Unspecified osteoarthritis, unspecified site: Secondary | ICD-10-CM | POA: Diagnosis not present

## 2021-12-27 DIAGNOSIS — Z6841 Body Mass Index (BMI) 40.0 and over, adult: Secondary | ICD-10-CM | POA: Diagnosis not present

## 2021-12-27 DIAGNOSIS — I700 Atherosclerosis of aorta: Secondary | ICD-10-CM | POA: Diagnosis not present

## 2021-12-27 DIAGNOSIS — F17211 Nicotine dependence, cigarettes, in remission: Secondary | ICD-10-CM | POA: Diagnosis not present

## 2021-12-27 DIAGNOSIS — G4700 Insomnia, unspecified: Secondary | ICD-10-CM | POA: Diagnosis not present

## 2021-12-29 ENCOUNTER — Ambulatory Visit (INDEPENDENT_AMBULATORY_CARE_PROVIDER_SITE_OTHER): Payer: No Typology Code available for payment source | Admitting: Family

## 2021-12-29 DIAGNOSIS — M76822 Posterior tibial tendinitis, left leg: Secondary | ICD-10-CM

## 2021-12-29 MED ORDER — PREDNISONE 10 MG PO TABS: 10.0000 mg | ORAL_TABLET | Freq: Every day | ORAL | 0 refills | Status: DC

## 2022-01-02 ENCOUNTER — Encounter: Payer: Self-pay | Admitting: Family

## 2022-01-02 NOTE — Progress Notes (Signed)
Office Visit Note   Patient: Ricky Dorsey           Date of Birth: 1945-09-11           MRN: [id number] Visit Date: 12/29/2021              Requested by: No referring provider defined for this encounter. PCP: Patient, No Pcp Per  Chief Complaint  Patient presents with   Left Foot - Follow-up      HPI: The patient is a 76 year old gentleman seen today in follow-up for left posterior tibial insufficiency.  He has been in a cam walker with an arch support medially he states he feels about 50% better.  Has been using Advil twice a day   several month history of left foot and ankle pain this is primarily over the medial ankle and foot.  Made worse by weightbearing gradually worsens the longer that he bears weight.  Did not have a sudden onset has been gradually worsening   Quite frustrated.  Wondering how long will take to resolve.  Assessment & Plan: Visit Diagnoses:  No diagnosis found.   Plan: Posterior tibial insufficiency on the left.   We will place on a low-dose prednisone for the next several weeks.  Discussed course of prednisone.  Continue with supportive arches he may advance to regular shoewear with an arch support does have sole orthotics at home  Follow-Up Instructions: No follow-ups on file.   Ortho Exam  Patient is alert, oriented, no adenopathy, well-dressed, normal affect, normal respiratory effort. On examination of the left foot and ankle there is minimal edema however he is point tender along the course of posterior tibial tendon, most at insertion.  Unable to do single limb heel raise  Imaging: No results found. No images are attached to the encounter.  Labs: Lab Results  Component Value Date   HGBA1C 5.3 04/12/2017     Lab Results  Component Value Date   ALBUMIN 3.5 07/16/2018    Lab Results  Component Value Date   MG 2.2 04/12/2017   Lab Results  Component Value Date   VD25OH 30 04/12/2017    No results found for: "PREALBUMIN"     Latest Ref Rng & Units 07/16/2018    7:11 AM 09/17/2017    1:21 PM 04/12/2017    4:51 PM  CBC EXTENDED  WBC 4.0 - 10.5 K/uL 6.1  7.7  7.8   RBC 4.22 - 5.81 MIL/uL 5.22  5.05  5.50   Hemoglobin 13.0 - 17.0 g/dL 15.9  15.9  17.6   HCT 39.0 - 52.0 % 49.1  46.3  50.0   Platelets 150 - 400 K/uL 227  178  271   NEUT# 1,500 - 7,800 cells/uL   4,937   Lymph# 850 - 3,900 cells/uL   1,700      There is no height or weight on file to calculate BMI.  Orders:  No orders of the defined types were placed in this encounter.  Meds ordered this encounter  Medications   predniSONE (DELTASONE) 10 MG tablet    Sig: Take 1 tablet (10 mg total) by mouth daily with breakfast.    Dispense:  40 tablet    Refill:  0      Procedures: No procedures performed  Clinical Data: No additional findings.  ROS:  All other systems negative, except as noted in the HPI. Review of Systems  Constitutional: Negative.   Musculoskeletal:  Positive for arthralgias, gait problem  and myalgias.    Objective: Vital Signs: There were no vitals taken for this visit.  Specialty Comments:  No specialty comments available.  PMFS History: Patient Active Problem List   Diagnosis Date Noted   Positive colorectal cancer screening using Cologuard test 05/23/2020   Cellulitis of fourth toe of right foot 07/10/2018   Osteomyelitis of fourth toe of right foot ([place]) 07/10/2018   Osteomyelitis of second toe of right foot ([place]) 07/10/2018   Atrial fibrillation ([place]) 04/15/2017   Class 3 severe obesity with body mass index (BMI) of 40.0 to 44.9 in adult ([place]) 04/15/2017   Hx of sleep apnea 04/15/2017   HTN (hypertension) 01/24/2016   Hyperlipidemia 08/14/2014   Obstructive sleep apnea 06/17/2012   Past Medical History:  Diagnosis Date   Atrial fibrillation ([place]) 2016   Hypertension 2016   no longer an issue per patient 07/15/2018   Pneumonia 05/2018   Sleep apnea 2016   Does not use CPAP   Wears glasses    reading    Wears partial dentures    upper    Family History  Problem Relation Age of Onset   Diabetes Mother    Heart disease Father    Heart disease Brother    Cancer Brother        lung cancer    Past Surgical History:  Procedure Laterality Date   AMPUTATION Right 07/16/2018   Procedure: AMPUTATION DIP (DISTAL INTERPHALANGEAL) JOINT 2ND AND 4TH TOE RIGHT FOOT;  Surgeon: Newt Minion, MD;  Location: [facility];  Service: Orthopedics;  Laterality: Right;   COLONOSCOPY     Normal   HERNIA REPAIR     umbilical   KNEE SURGERY Bilateral    toe removal Right    tip of middle toe, trimmed 3 toes   Social History   Occupational History   Occupation: retired  Tobacco Use   Smoking status: Former    Types: Cigarettes, Pipe   Smokeless tobacco: Never   Tobacco comments:    stop smoking 30 years ago  Scientific laboratory technician Use: Never used  Substance and Sexual Activity   Alcohol use: Yes    Comment: Rum 2-3 times per every 2 weeks   Drug use: No   Sexual activity: Not on file

## 2022-01-24 ENCOUNTER — Ambulatory Visit (INDEPENDENT_AMBULATORY_CARE_PROVIDER_SITE_OTHER): Payer: No Typology Code available for payment source | Admitting: Family

## 2022-01-24 ENCOUNTER — Encounter: Payer: Self-pay | Admitting: Family

## 2022-01-24 DIAGNOSIS — M76822 Posterior tibial tendinitis, left leg: Secondary | ICD-10-CM | POA: Diagnosis not present

## 2022-01-24 NOTE — Progress Notes (Signed)
Office Visit Note   Patient: Ricky Dorsey           Date of Birth: 22-Feb-1946           MRN: [id number] Visit Date: 01/24/2022              Requested by: No referring provider defined for this encounter. PCP: Patient, No Pcp Per  Chief Complaint  Patient presents with   Left Foot - Follow-up      HPI: The patient is a 76 year old gentleman who returns today for ongoing issues with pain in his left foot and ankle posterior tibial tendinitis.  He has been in a cam walker for several weeks this began to cause him pain in his back and lower leg he states that he did not take the prednisone every day but did use the 10 mg prednisone most days and has used some ibuprofen on days he does not take the prednisone he is mostly pain-free now he states that if he is up on his feet quite a bit over the last 1 week he has noticed he will have aching pain if he is standing or walking for more than an hour but taking ibuprofen and resting relieves his pain  Discussed possibility of shockwave therapy with Dr. Rolena Infante he is interested but will need a referral  Assessment & Plan: Visit Diagnoses:  1. Posterior tibial tendinitis, left leg     Plan: Referral to Dr. Rolena Infante placed.  If he has any further flares he states they will call and schedule an appointment with him We will continue with his orthotics and supportive shoe wear.  Follow-Up Instructions: No follow-ups on file.   Ortho Exam  Patient is alert, oriented, no adenopathy, well-dressed, normal affect, normal respiratory effort. On examination of the left foot and ankle there is no edema no erythema does have tenderness along the course of the posterior tibial tendon remains quite painful to do single limb heel raise  Imaging: No results found. No images are attached to the encounter.  Labs: Lab Results  Component Value Date   HGBA1C 5.3 04/12/2017     Lab Results  Component Value Date   ALBUMIN 3.5 07/16/2018    Lab  Results  Component Value Date   MG 2.2 04/12/2017   Lab Results  Component Value Date   VD25OH 30 04/12/2017    No results found for: "PREALBUMIN"    Latest Ref Rng & Units 07/16/2018    7:11 AM 09/17/2017    1:21 PM 04/12/2017    4:51 PM  CBC EXTENDED  WBC 4.0 - 10.5 K/uL 6.1  7.7  7.8   RBC 4.22 - 5.81 MIL/uL 5.22  5.05  5.50   Hemoglobin 13.0 - 17.0 g/dL 15.9  15.9  17.6   HCT 39.0 - 52.0 % 49.1  46.3  50.0   Platelets 150 - 400 K/uL 227  178  271   NEUT# 1,500 - 7,800 cells/uL   4,937   Lymph# 850 - 3,900 cells/uL   1,700      There is no height or weight on file to calculate BMI.  Orders:  Orders Placed This Encounter  Procedures   AMB referral to sports medicine   No orders of the defined types were placed in this encounter.    Procedures: No procedures performed  Clinical Data: No additional findings.  ROS:  All other systems negative, except as noted in the HPI. Review of Systems  Objective: Vital  Signs: There were no vitals taken for this visit.  Specialty Comments:  No specialty comments available.  PMFS History: Patient Active Problem List   Diagnosis Date Noted   Positive colorectal cancer screening using Cologuard test 05/23/2020   Cellulitis of fourth toe of right foot 07/10/2018   Osteomyelitis of fourth toe of right foot ([place]) 07/10/2018   Osteomyelitis of second toe of right foot ([place]) 07/10/2018   Atrial fibrillation ([place]) 04/15/2017   Class 3 severe obesity with body mass index (BMI) of 40.0 to 44.9 in adult ([place]) 04/15/2017   Hx of sleep apnea 04/15/2017   HTN (hypertension) 01/24/2016   Hyperlipidemia 08/14/2014   Obstructive sleep apnea 06/17/2012   Past Medical History:  Diagnosis Date   Atrial fibrillation ([place]) 2016   Hypertension 2016   no longer an issue per patient 07/15/2018   Pneumonia 05/2018   Sleep apnea 2016   Does not use CPAP   Wears glasses    reading   Wears partial dentures    upper    Family History   Problem Relation Age of Onset   Diabetes Mother    Heart disease Father    Heart disease Brother    Cancer Brother        lung cancer    Past Surgical History:  Procedure Laterality Date   AMPUTATION Right 07/16/2018   Procedure: AMPUTATION DIP (DISTAL INTERPHALANGEAL) JOINT 2ND AND 4TH TOE RIGHT FOOT;  Surgeon: Newt Minion, MD;  Location: [facility];  Service: Orthopedics;  Laterality: Right;   COLONOSCOPY     Normal   HERNIA REPAIR     umbilical   KNEE SURGERY Bilateral    toe removal Right    tip of middle toe, trimmed 3 toes   Social History   Occupational History   Occupation: retired  Tobacco Use   Smoking status: Former    Types: Cigarettes, Pipe   Smokeless tobacco: Never   Tobacco comments:    stop smoking 30 years ago  Scientific laboratory technician Use: Never used  Substance and Sexual Activity   Alcohol use: Yes    Comment: Rum 2-3 times per every 2 weeks   Drug use: No   Sexual activity: Not on file

## 2022-01-26 ENCOUNTER — Ambulatory Visit: Payer: No Typology Code available for payment source | Admitting: Family

## 2022-02-24 ENCOUNTER — Emergency Department (HOSPITAL_COMMUNITY): Payer: No Typology Code available for payment source

## 2022-02-24 ENCOUNTER — Encounter (HOSPITAL_COMMUNITY): Payer: Self-pay | Admitting: Emergency Medicine

## 2022-02-24 ENCOUNTER — Other Ambulatory Visit: Payer: Self-pay

## 2022-02-24 ENCOUNTER — Emergency Department (HOSPITAL_COMMUNITY)
Admission: EM | Admit: 2022-02-24 | Discharge: 2022-02-24 | Disposition: A | Discharge status: Home / Self Care | Payer: No Typology Code available for payment source | Attending: Emergency Medicine | Admitting: Emergency Medicine

## 2022-02-24 DIAGNOSIS — Z79899 Other long term (current) drug therapy: Secondary | ICD-10-CM | POA: Diagnosis not present

## 2022-02-24 DIAGNOSIS — I10 Essential (primary) hypertension: Secondary | ICD-10-CM | POA: Insufficient documentation

## 2022-02-24 DIAGNOSIS — I4891 Unspecified atrial fibrillation: Secondary | ICD-10-CM | POA: Insufficient documentation

## 2022-02-24 DIAGNOSIS — J189 Pneumonia, unspecified organism: Secondary | ICD-10-CM | POA: Diagnosis not present

## 2022-02-24 DIAGNOSIS — Z20822 Contact with and (suspected) exposure to covid-19: Secondary | ICD-10-CM | POA: Diagnosis not present

## 2022-02-24 DIAGNOSIS — Z7982 Long term (current) use of aspirin: Secondary | ICD-10-CM | POA: Diagnosis not present

## 2022-02-24 DIAGNOSIS — R519 Headache, unspecified: Secondary | ICD-10-CM | POA: Insufficient documentation

## 2022-02-24 DIAGNOSIS — J181 Lobar pneumonia, unspecified organism: Secondary | ICD-10-CM | POA: Insufficient documentation

## 2022-02-24 DIAGNOSIS — R059 Cough, unspecified: Secondary | ICD-10-CM | POA: Diagnosis not present

## 2022-02-24 DIAGNOSIS — R0602 Shortness of breath: Secondary | ICD-10-CM | POA: Diagnosis not present

## 2022-02-24 DIAGNOSIS — Z87891 Personal history of nicotine dependence: Secondary | ICD-10-CM | POA: Diagnosis not present

## 2022-02-24 DIAGNOSIS — H538 Other visual disturbances: Secondary | ICD-10-CM | POA: Diagnosis not present

## 2022-02-24 LAB — CBC
HCT: 48.7 % (ref 39.0–52.0)
Hemoglobin: 16.7 g/dL (ref 13.0–17.0)
MCH: 32 pg (ref 26.0–34.0)
MCHC: 34.3 g/dL (ref 30.0–36.0)
MCV: 93.3 fL (ref 80.0–100.0)
Platelets: 201 10*3/uL (ref 150–400)
RBC: 5.22 MIL/uL (ref 4.22–5.81)
RDW: 12.9 % (ref 11.5–15.5)
WBC: 7.9 10*3/uL (ref 4.0–10.5)
nRBC: 0 % (ref 0.0–0.2)

## 2022-02-24 LAB — COMPREHENSIVE METABOLIC PANEL
ALT: 25 U/L (ref 0–44)
AST: 21 U/L (ref 15–41)
Albumin: 4 g/dL (ref 3.5–5.0)
Alkaline Phosphatase: 43 U/L (ref 38–126)
Anion gap: 7 (ref 5–15)
BUN: 16 mg/dL (ref 8–23)
CO2: 26 mmol/L (ref 22–32)
Calcium: 8.9 mg/dL (ref 8.9–10.3)
Chloride: 104 mmol/L (ref 98–111)
Creatinine, Ser: 0.8 mg/dL (ref 0.61–1.24)
GFR, Estimated: >60 mL/min (ref >60)
Glucose, Bld: 123 mg/dL — ABNORMAL HIGH (ref 70–99)
Potassium: 4.2 mmol/L (ref 3.5–5.1)
Sodium: 137 mmol/L (ref 135–145)
Total Bilirubin: 1 mg/dL (ref 0.3–1.2)
Total Protein: 7.4 g/dL (ref 6.5–8.1)

## 2022-02-24 LAB — PROTIME-INR
INR: 1.1 (ref 0.8–1.2)
Prothrombin Time: 13.9 seconds (ref 11.4–15.2)

## 2022-02-24 LAB — DIFFERENTIAL
Abs Immature Granulocytes: 0.02 10*3/uL (ref 0.00–0.07)
Basophils Absolute: 0 10*3/uL (ref 0.0–0.1)
Basophils Relative: 0 %
Eosinophils Absolute: 0 10*3/uL (ref 0.0–0.5)
Eosinophils Relative: 1 %
Immature Granulocytes: 0 %
Lymphocytes Relative: 7 %
Lymphs Abs: 0.6 10*3/uL — ABNORMAL LOW (ref 0.7–4.0)
Monocytes Absolute: 0.9 10*3/uL (ref 0.1–1.0)
Monocytes Relative: 11 %
Neutro Abs: 6.4 10*3/uL (ref 1.7–7.7)
Neutrophils Relative %: 81 %

## 2022-02-24 LAB — SARS CORONAVIRUS 2 BY RT PCR: SARS Coronavirus 2 by RT PCR: NEGATIVE

## 2022-02-24 LAB — APTT: aPTT: 35 seconds (ref 24–36)

## 2022-02-24 LAB — ETHANOL: Alcohol, Ethyl (B): <10 mg/dL (ref <10)

## 2022-02-24 LAB — SEDIMENTATION RATE: Sed Rate: 8 mm/hr (ref 0–16)

## 2022-02-24 MED ORDER — SODIUM CHLORIDE 0.9 % IV SOLN
1.0000 g | Freq: Once | INTRAVENOUS | Status: AC
  Administered 2022-02-24: 1 g via INTRAVENOUS
  Filled 2022-02-24: qty 10

## 2022-02-24 MED ORDER — DOXYCYCLINE MONOHYDRATE 100 MG PO CAPS: 100.0000 mg | ORAL_CAPSULE | Freq: Two times a day (BID) | ORAL | 0 refills | Status: AC

## 2022-02-24 MED ORDER — HYDROCODONE-ACETAMINOPHEN 5-325 MG PO TABS
1.0000 | ORAL_TABLET | Freq: Once | ORAL | Status: AC
  Administered 2022-02-24: 1 via ORAL
  Filled 2022-02-24: qty 1

## 2022-02-24 MED ORDER — DOXYCYCLINE HYCLATE 100 MG PO TABS
100.0000 mg | ORAL_TABLET | Freq: Once | ORAL | Status: AC
  Administered 2022-02-24: 100 mg via ORAL
  Filled 2022-02-24: qty 1

## 2022-02-24 MED ORDER — ALBUTEROL SULFATE HFA 108 (90 BASE) MCG/ACT IN AERS
2.0000 | INHALATION_SPRAY | Freq: Four times a day (QID) | RESPIRATORY_TRACT | Status: DC | PRN
  Administered 2022-02-24: 2 via RESPIRATORY_TRACT
  Filled 2022-02-24: qty 6.7

## 2022-02-24 NOTE — ED Provider Triage Note (Signed)
Emergency Medicine Provider Triage Evaluation Note  Ricky Dorsey , a 76 y.o. male  was evaluated in triage.  Pt complains of multiple complaints.  He has had a recent cough and congestion and taking OTC meds.  Tonight he woke up with headache and reports his vision at times feels more blurred.  No focal weakness.  Review of Systems  Positive: Headache Negative: No weakness  Physical Exam  BP (!) 212/105   Pulse (!) 104   Temp 98.8 F (37.1 C)   Resp 18   Ht 1.702 m ('5\' 7"'$ )   Wt 117 kg   SpO2 96%   BMI 40.40 kg/m  Gen:   Awake, no distress appears uncomfortable Resp:  Normal effort  MSK:   Moves extremities without difficulty  Other:  No facial droop, no arm or leg drift,+EOMI, pupils equal and reactive, no corneal haziness or conjunctival injection, no rash noted to forehead  Medical Decision Making  Medically screening exam initiated at 5:34 AM.  Appropriate orders placed.  Ricky Dorsey was informed that the remainder of the evaluation will be completed by another provider, this initial triage assessment does not replace that evaluation, and the importance of remaining in the ED until their evaluation is complete.     Ripley Fraise, MD 02/24/22 [phone]

## 2022-02-24 NOTE — ED Provider Notes (Signed)
Ricky Dorsey Provider Note   CSN: [id number] Arrival date & time: 02/24/22  0510     History  Chief Complaint  Patient presents with   Headache    Ricky Dorsey is a 76 y.o. male.  Patient is a 76 year old male who presents with cough and headache.  He has a history of prior atrial fibrillation, hypertension, chronic vision loss.  He states he has had a cough and congestion in his chest for about 4 days.  He has had some intermittent shortness of breath.  He said he has had a dull headache.  He started having a dull achy headache to the right side of his forehead area yesterday afternoon.  [name] it got worse during the night and he was woken up in the middle the night with an intense pain to his right forehead area.  He did have some blurry vision in his right eye as well.  No dizziness.  No difficulty with his balance.  No speech deficits.  No numbness or weakness to his extremities.  He was given a dose of hydrocodone in the ED and his symptoms have completely resolved since then.  He currently denies any significant headache.  His vision is back to baseline.  He does have some chronic vision loss but says it is back to baseline.  No associated nausea and vomiting.  He denies any significant nasal congestion.  He denies any known fevers.  He has had some wheezing at home which she says he has had before with colds and congestion in his chest.  No diagnosis of COPD.  He quit smoking many years ago.  He does not use inhalers at home.       Home Medications Prior to Admission medications   Medication Sig Start Date End Date Taking? Authorizing Provider  doxycycline (MONODOX) 100 MG capsule Take 1 capsule (100 mg total) by mouth 2 (two) times daily. 02/24/22  Yes Malvin Johns, MD  aspirin EC 81 MG tablet Take 81 mg by mouth at bedtime.    [provider]  divalproex (DEPAKOTE SPRINKLE) 125 MG capsule Take 125 mg by mouth 3 (three) times daily as  needed.    [provider]  Magnesium 500 MG TABS Take 500 mg by mouth daily.    [provider]  Multiple Vitamin (MULTIVITAMIN WITH MINERALS) TABS tablet Take 1 tablet by mouth daily.    [provider]  predniSONE (DELTASONE) 10 MG tablet Take 1 tablet (10 mg total) by mouth daily with breakfast. 12/29/21   Suzan Slick, NP  QUEtiapine (SEROQUEL) 25 MG tablet Take 25 mg by mouth at bedtime.    [provider]      Allergies    Azithromycin    Review of Systems   Review of Systems  Constitutional:  Positive for fatigue. Negative for chills, diaphoresis and fever.  HENT:  Negative for congestion, rhinorrhea and sneezing.   Eyes:  Positive for visual disturbance.  Respiratory:  Positive for cough, shortness of breath and wheezing. Negative for chest tightness.   Cardiovascular:  Negative for chest pain and leg swelling.  Gastrointestinal:  Negative for abdominal pain, blood in stool, diarrhea, nausea and vomiting.  Genitourinary:  Negative for difficulty urinating, flank pain, frequency and hematuria.  Musculoskeletal:  Negative for arthralgias and back pain.  Skin:  Negative for rash.  Neurological:  Positive for headaches. Negative for dizziness, speech difficulty, weakness and numbness.    Physical Exam Updated Vital  Signs BP 120/72 (BP Location: Right Arm)   Pulse 67   Temp 98.6 F (37 C) (Oral)   Resp 16   Ht '5\' 7"'$  (1.702 m)   Wt 117 kg   SpO2 100%   BMI 40.40 kg/m  Physical Exam Constitutional:      Appearance: He is well-developed.  HENT:     Head: Normocephalic and atraumatic.     Comments: No pain over the temporal artery Eyes:     Extraocular Movements: Extraocular movements intact.     Pupils: Pupils are equal, round, and reactive to light.  Neck:     Comments: No meningismus Cardiovascular:     Rate and Rhythm: Normal rate and regular rhythm.     Heart sounds: Normal heart sounds.  Pulmonary:     Effort: Pulmonary  effort is normal. No respiratory distress.     Breath sounds: Wheezing present. No rales.     Comments: Few scarce wheezes present on expiration, no increased work of breathing Chest:     Chest wall: No tenderness.  Abdominal:     General: Bowel sounds are normal.     Palpations: Abdomen is soft.     Tenderness: There is no abdominal tenderness. There is no guarding or rebound.  Musculoskeletal:        General: Normal range of motion.     Cervical back: Normal range of motion and neck supple.  Lymphadenopathy:     Cervical: No cervical adenopathy.  Skin:    General: Skin is warm and dry.     Findings: No rash.  Neurological:     Mental Status: He is alert and oriented to person, place, and time.     Comments: Motor 5/5 all extremities Sensation grossly intact to LT all extremities Finger to Nose intact, no pronator drift CN II-XII grossly intact       ED Results / Procedures / Treatments   Labs (all labs ordered are listed, but only abnormal results are displayed) Labs Reviewed  DIFFERENTIAL - Abnormal; Notable for the following components:      Result Value   Lymphs Abs 0.6 (*)    All other components within normal limits  COMPREHENSIVE METABOLIC PANEL - Abnormal; Notable for the following components:   Glucose, Bld 123 (*)    All other components within normal limits  SARS CORONAVIRUS 2 BY RT PCR  PROTIME-INR  APTT  CBC  ETHANOL  SEDIMENTATION RATE    EKG EKG Interpretation  Date/Time:  Saturday February 24 2022 05:43:33 EST Ventricular Rate:  104 PR Interval:    QRS Duration: 119 QT Interval:  338 QTC Calculation: 445 R Axis:   183 Text Interpretation: Atrial fibrillation Incomplete right bundle branch block Low voltage, extremity leads No significant change since last tracing Confirmed by Ripley Fraise [phone]) on 02/24/2022 6:02:44 AM  Radiology CT HEAD WO CONTRAST  Result Date: 02/24/2022 CLINICAL DATA:  76 year old male with increasing headache  since yesterday. Blurred vision. EXAM: CT HEAD WITHOUT CONTRAST TECHNIQUE: Contiguous axial images were obtained from the base of the skull through the vertex without intravenous contrast. RADIATION DOSE REDUCTION: This exam was performed according to the departmental dose-optimization program which includes automated exposure control, adjustment of the mA and/or kV according to patient size and/or use of iterative reconstruction technique. COMPARISON:  None Available. FINDINGS: Brain: Patchy and confluent bilateral cerebral white matter hypodensity. Cystic encephalomalacia in the left occipital lobe, PCA territory with some ex vacuo enlargement of the left occipital horn.a  the nearby infratentorial left lateral cerebellar encephalomalacia (series 4, image 53 and series 2, image 10). No superimposed midline shift, ventriculomegaly, mass effect, evidence of mass lesion, intracranial hemorrhage or evidence of cortically based acute infarction. Gray-white matter differentiation is within normal limits throughout the brain. Vascular: Calcified atherosclerosis at the skull base. No suspicious intracranial vascular hyperdensity. Skull: Negative. Sinuses/Orbits: Mild motion artifact. Visualized paranasal sinuses and mastoids are clear. Other: Visualized orbits and scalp soft tissues are within normal limits. IMPRESSION: 1. No acute intracranial abnormality identified. 2. Advanced but nonspecific cerebral white matter disease is superimposed on chronic Left PCA and Left cerebellar artery territory infarcts. Electronically Signed   By: Genevie Ann M.D.   On: 02/24/2022 06:37   DG Chest 2 View  Result Date: 02/24/2022 CLINICAL DATA:  Cough. EXAM: CHEST - 2 VIEW COMPARISON:  09/17/2017 FINDINGS: Lungs are hyperexpanded. Patchy airspace disease noted infrahilar right lung base, new in the interval. Stable streaky opacity at the left base is probably chronic atelectasis or scar. The cardiopericardial silhouette is within normal  limits for size. No evidence for pleural effusion. No pneumothorax. The visualized bony structures of the thorax are unremarkable. IMPRESSION: New patchy airspace disease at the right base compatible with pneumonia. Electronically Signed   By: Misty Stanley M.D.   On: 02/24/2022 06:23    Procedures Procedures    Medications Ordered in ED Medications  albuterol (VENTOLIN HFA) 108 (90 Base) MCG/ACT inhaler 2 puff (2 puffs Inhalation Given 02/24/22 1457)  HYDROcodone-acetaminophen (NORCO/VICODIN) 5-325 MG per tablet 1 tablet (1 tablet Oral Given 02/24/22 0545)  cefTRIAXone (ROCEPHIN) 1 g in sodium chloride 0.9 % 100 mL IVPB (1 g Intravenous New Bag/Given 02/24/22 1430)  doxycycline (VIBRA-TABS) tablet 100 mg (100 mg Oral Given 02/24/22 1430)    ED Course/ Medical Decision Making/ A&P                           Medical Decision Making Amount and/or Complexity of Data Reviewed Labs: ordered. Radiology: ordered.  Risk Prescription drug management.   Patient is a 76 year old male who presents with cough and cold symptoms for the last 4 days.  He did have onset of a headache yesterday.  It was gradual onset and then worsened during the night.  He did not have any neurologic deficits or suggestions of a stroke.  No neck pain, sudden onset, thunderclap headache or other symptoms suggestive of subarachnoid hemorrhage or meningitis.  His pain completely resolved with 1 dose of Vicodin in the ED.  No ongoing symptoms.  His sed rate is negative which would make temporal arteritis unlikely.  Labs reviewed.  These are nonconcerning.  His COVID test is negative.  He had a chest x-ray which was interpreted by me and confirmed by the radiologist to show evidence of right lower lobe infiltrate.  He has no hypoxia.  His heart rate is in the 80s and 90s on my exam.  No shortness of breath.  He has a little bit of wheezing on exam.  He was dispensed an albuterol inhaler to help with this.  No increased work of  breathing or active shortness of breath currently.  He was given dose of IV Rocephin and will be started on doxycycline.  He was advised to stop taking the over-the-counter cold medicine that he has been taking as it likely is not safe with his diagnosis of hypertension.  He was recommended to use Mucinex.  He was recommended  to have close follow-up with his primary care doctor provider.  I did discuss the CT findings.  He has no acute abnormalities.  No suggestions of a stroke or intracranial hemorrhage.  However there is evidence of old infarcts.  I discussed following up with his primary care doctor to make sure his modifiable risk factors are all addressed.  Strict return precautions were given.  At this point I had discussion with him and feel that outpatient treatment is appropriate.  However he was advised to return for any worsening symptoms and may need hospitalization at that point.  Final Clinical Impression(s) / ED Diagnoses Final diagnoses:  Acute nonintractable headache, unspecified headache type  Community acquired pneumonia of right lower lobe of lung    Rx / DC Orders ED Discharge Orders          Ordered    doxycycline (MONODOX) 100 MG capsule  2 times daily        02/24/22 1516              Malvin Johns, MD 02/24/22 1521

## 2022-02-24 NOTE — Discharge Instructions (Signed)
Stop taking your over-the-counter cold medicine.  This may not be safe with your history of hypertension.  I would recommend taking plain Mucinex.  You can take the generic version which is guaifenesin.  You have been given albuterol inhaler.  You can use 2 puffs every 4-6 hours for the next few days.  I have started you on antibiotics.  You received your first dose here in the emergency room.  You can start taking it tomorrow.  Make sure that you have close follow-up with your primary care doctor.  Return to the emergency room if you have any worsening symptoms or you are not noticing improvement.

## 2022-02-24 NOTE — ED Triage Notes (Signed)
Pt c/o right sided headache since yesterday, states that the pain has gotten worse in the last hour with vision that is blurrier than normal. Bp 212/105, hx of htn

## 2022-03-22 DIAGNOSIS — G4730 Sleep apnea, unspecified: Secondary | ICD-10-CM | POA: Diagnosis not present

## 2022-03-22 DIAGNOSIS — F411 Generalized anxiety disorder: Secondary | ICD-10-CM | POA: Diagnosis not present

## 2022-04-05 DIAGNOSIS — G4730 Sleep apnea, unspecified: Secondary | ICD-10-CM | POA: Diagnosis not present

## 2022-04-05 DIAGNOSIS — F411 Generalized anxiety disorder: Secondary | ICD-10-CM | POA: Diagnosis not present

## 2022-09-04 ENCOUNTER — Ambulatory Visit (INDEPENDENT_AMBULATORY_CARE_PROVIDER_SITE_OTHER): Payer: HMO | Admitting: Podiatry

## 2022-09-04 DIAGNOSIS — M79675 Pain in left toe(s): Secondary | ICD-10-CM | POA: Diagnosis not present

## 2022-09-04 DIAGNOSIS — M79674 Pain in right toe(s): Secondary | ICD-10-CM | POA: Diagnosis not present

## 2022-09-04 DIAGNOSIS — Z89421 Acquired absence of other right toe(s): Secondary | ICD-10-CM | POA: Diagnosis not present

## 2022-09-04 DIAGNOSIS — B351 Tinea unguium: Secondary | ICD-10-CM | POA: Diagnosis not present

## 2022-09-04 DIAGNOSIS — L853 Xerosis cutis: Secondary | ICD-10-CM

## 2022-09-04 NOTE — Progress Notes (Signed)
       Subjective:  Patient ID: Ricky Dorsey, male    DOB: 03-11-1946,  MRN: [id number]   Ricky Dorsey presents to clinic today for:  Chief Complaint  Patient presents with   Nail Problem    Rm 23 RFC bilateral nail trim 1-5.   Marland Kitchen Patient notes nails are thick, discolored, elongated and painful in shoegear when trying to ambulate.  Patient's wife is in the room with him today.  He notes that he had right knee surgery and developed an infection into the toes which resulted in amputation of 2 of the toes on the right foot.  He also gets hard dry skin on the tips of the toes on the right foot.  Patient is not diabetic.  PCP is Dois Davenport, MD.  Allergies  Allergen Reactions   Azithromycin Swelling, Rash and Other (See Comments)    Leg swelling with rash    Review of Systems: Negative except as noted in the HPI.  Objective:  There were no vitals filed for this visit.  Ricky Dorsey is a pleasant 77 y.o. male in NAD. AAO x 3.  Vascular Examination: Capillary refill time is 3-5 seconds to toes bilateral.  Palpable dorsalis pedis pulse bilateral.  Trace palpable posterior tibial pulse bilateral.. Digital hair sparse b/l. No pedal edema b/l. Skin temperature gradient WNL b/l. No varicosities b/l. No cyanosis or clubbing noted b/l.   Dermatological Examination: Pedal skin is dry, with normal texture and tone b/l. No open wounds. No interdigital macerations b/l. Toenails bilateral are 3mm thick, discolored, dystrophic with subungual debris. There is pain with compression of the nail plates.  They are elongated.  There are areas of jagged hyperkeratosis on the distal aspect of the toes on the right foot as well as the plantar medial aspect of the left first metatarsophalangeal joint.  No open lesions are noted.  Neurological Examination: Protective sensation intact bilateral LE. Vibratory sensation intact bilateral LE.  Musculoskeletal Examination: Muscle strength 5/5 to all LE muscle  groups b/l.   Assessment/Plan: 1. Pain due to onychomycosis of toenails of both feet   2. History of amputation of lesser toe, right (HCC)   3. Dry skin     The mycotic toenails were sharply debrided with sterile nail nippers and a power debriding burr to decrease bulk/thickness and length.    The hyperkeratotic skin was sanded with a sanding bur.  Patient noted he would be interested in coming on a regular basis to have his footcare performed.  Since he is not diabetic, requested that patient contact his insurance company to find out what his coverage would be for this type of service on a 56-month basis.  We will tentatively schedule him for 3 months for routine footcare, but the patient will need to call and change this appointment pending his conversation with his insurance carrier.  Return in about 3 months (around 12/05/2022) for RFC.   Clerance Lav, DPM, FACFAS [facility]     [address] [place], [place] [address]                Office [phone]  Fax [phone]

## 2022-12-24 ENCOUNTER — Ambulatory Visit: Payer: HMO | Admitting: Podiatry

## 2023-01-24 ENCOUNTER — Ambulatory Visit
Admission: EM | Admit: 2023-01-24 | Discharge: 2023-01-24 | Disposition: A | Discharge status: Home / Self Care | Payer: HMO

## 2023-01-24 DIAGNOSIS — M545 Low back pain, unspecified: Secondary | ICD-10-CM

## 2023-01-24 MED ORDER — PREDNISONE 20 MG PO TABS: 40.0000 mg | ORAL_TABLET | Freq: Every day | ORAL | 0 refills | Status: AC

## 2023-01-24 MED ORDER — LIDOCAINE 5 % EX PTCH: 1.0000 | MEDICATED_PATCH | CUTANEOUS | 0 refills | Status: AC

## 2023-01-24 MED ORDER — TIZANIDINE HCL 2 MG PO TABS: 2.0000 mg | ORAL_TABLET | Freq: Every day | ORAL | 0 refills | Status: DC | PRN

## 2023-01-24 NOTE — ED Provider Notes (Signed)
[facility]    CSN: [id number] Arrival date & time: 01/24/23  1132      History   Chief Complaint Chief Complaint  Patient presents with   Back Pain    HPI Ricky Dorsey is a 77 y.o. male.   Patient presents with left lower back pain that started about 3 days ago.  He denies any injury to the area or any history of chronic back pain.  States that movement elicits pain.  Pain does not radiate down legs.  Patient denies urinary frequency, urinary or bowel continence, saddle anesthesia.   Back Pain   Past Medical History:  Diagnosis Date   Atrial fibrillation (HCC) 2016   Hypertension 2016   no longer an issue per patient 07/15/2018   Pneumonia 05/2018   Sleep apnea 2016   Does not use CPAP   Wears glasses    reading   Wears partial dentures    upper    Patient Active Problem List   Diagnosis Date Noted   Positive colorectal cancer screening using Cologuard test 05/23/2020   Cellulitis of fourth toe of right foot 07/10/2018   Osteomyelitis of fourth toe of right foot (HCC) 07/10/2018   Osteomyelitis of second toe of right foot (HCC) 07/10/2018   Atrial fibrillation (HCC) 04/15/2017   Class 3 severe obesity with body mass index (BMI) of 40.0 to 44.9 in adult (HCC) 04/15/2017   Hx of sleep apnea 04/15/2017   HTN (hypertension) 01/24/2016   Hyperlipidemia 08/14/2014   Obstructive sleep apnea 06/17/2012    Past Surgical History:  Procedure Laterality Date   AMPUTATION Right 07/16/2018   Procedure: AMPUTATION DIP (DISTAL INTERPHALANGEAL) JOINT 2ND AND 4TH TOE RIGHT FOOT;  Surgeon: Nadara Mustard, MD;  Location: [facility] OR;  Service: Orthopedics;  Laterality: Right;   COLONOSCOPY     Normal   HERNIA REPAIR     umbilical   KNEE SURGERY Bilateral    toe removal Right    tip of middle toe, trimmed 3 toes       Home Medications    Prior to Admission medications   Medication Sig Start Date End Date Taking? Authorizing Provider  aspirin EC 81 MG tablet  Take 81 mg by mouth at bedtime.   Yes [provider]  lidocaine (LIDODERM) 5 % Place 1 patch onto the skin daily. Remove & Discard patch within 12 hours or as directed by MD 01/24/23  Yes Gustavus Bryant, FNP  losartan (COZAAR) 100 MG tablet Take 100 mg by mouth daily. 12/10/22  Yes [provider]  Magnesium 500 MG TABS Take 500 mg by mouth daily.   Yes [provider]  Multiple Vitamin (MULTIVITAMIN WITH MINERALS) TABS tablet Take 1 tablet by mouth daily.   Yes [provider]  predniSONE (DELTASONE) 20 MG tablet Take 2 tablets (40 mg total) by mouth daily for 5 days. 01/24/23 01/29/23 Yes Zaylah Blecha, Acie Fredrickson, FNP  QUEtiapine (SEROQUEL) 25 MG tablet Take 25 mg by mouth at bedtime.   Yes [provider]  tamsulosin (FLOMAX) 0.4 MG CAPS capsule Take 0.4 mg by mouth at bedtime. 12/09/22  Yes [provider]  tiZANidine (ZANAFLEX) 2 MG tablet Take 1 tablet (2 mg total) by mouth daily as needed for muscle spasms. 01/24/23  Yes Phillippe Orlick, Rolly Salter E, FNP  divalproex (DEPAKOTE SPRINKLE) 125 MG capsule Take 125 mg by mouth 3 (three) times daily as needed.    [provider]  doxycycline (MONODOX) 100 MG capsule  Take 1 capsule (100 mg total) by mouth 2 (two) times daily. 02/24/22   Rolan Bucco, MD    Family History Family History  Problem Relation Age of Onset   Diabetes Mother    Heart disease Father    Heart disease Brother    Cancer Brother        lung cancer    Social History Social History   Tobacco Use   Smoking status: Former    Types: Cigarettes, Pipe   Smokeless tobacco: Never   Tobacco comments:    stop smoking 30 years ago  Vaping Use   Vaping status: Never Used  Substance Use Topics   Alcohol use: Yes    Comment: Rum 2-3 times per every 2 weeks   Drug use: No     Allergies   Azithromycin   Review of Systems Review of Systems Per HPI  Physical Exam Triage Vital Signs ED Triage Vitals  Encounter Vitals Group      BP 01/24/23 1247 114/79     Systolic BP Percentile --      Diastolic BP Percentile --      Pulse Rate 01/24/23 1247 80     Resp 01/24/23 1247 18     Temp 01/24/23 1247 97.7 F (36.5 C)     Temp Source 01/24/23 1247 Oral     SpO2 01/24/23 1247 95 %     Weight --      Height --      Head Circumference --      Peak Flow --      Pain Score 01/24/23 1245 10     Pain Loc --      Pain Education --      Exclude from Growth Chart --    No data found.  Updated Vital Signs BP 114/79 (BP Location: Left Arm)   Pulse 80   Temp 97.7 F (36.5 C) (Oral)   Resp 18   SpO2 95%   Visual Acuity Right Eye Distance:   Left Eye Distance:   Bilateral Distance:    Right Eye Near:   Left Eye Near:    Bilateral Near:     Physical Exam Constitutional:      General: He is not in acute distress.    Appearance: Normal appearance. He is not toxic-appearing or diaphoretic.  HENT:     Head: Normocephalic and atraumatic.  Eyes:     Extraocular Movements: Extraocular movements intact.     Conjunctiva/sclera: Conjunctivae normal.  Pulmonary:     Effort: Pulmonary effort is normal.  Musculoskeletal:     Comments: No tenderness to palpation to lower back.  No direct spinal tenderness, crepitus, step-off noted.  No swelling or discoloration noted.  Neurological:     General: No focal deficit present.     Mental Status: He is alert and oriented to person, place, and time. Mental status is at baseline.     Deep Tendon Reflexes: Reflexes are normal and symmetric.  Psychiatric:        Mood and Affect: Mood normal.        Behavior: Behavior normal.        Thought Content: Thought content normal.        Judgment: Judgment normal.      UC Treatments / Results  Labs (all labs ordered are listed, but only abnormal results are displayed) Labs Reviewed - No data to display  EKG   Radiology No results found.  Procedures Procedures (including critical  care time)  Medications Ordered in  UC Medications - No data to display  Initial Impression / Assessment and Plan / UC Course  I have reviewed the triage vital signs and the nursing notes.  Pertinent labs & imaging results that were available during my care of the patient were reviewed by me and considered in my medical decision making (see chart for details).     Suspect lumbar strain given physical exam and patient's symptoms.  I do think patient would benefit from muscle relaxer but he does take Depakote and Seroquel.  Therefore, discussed risks of taking these medications in conjunction with muscle relaxer and increased drowsiness that can occur.  Patient voiced understanding of risks and wishes to proceed with muscle relaxer.  Advised patient to not drive or drink alcohol with taking this medication.  Also prescribed  prednisone to decrease inflammation as he reports that he has taken this previously and tolerated well.  No obvious contraindication to steroid therapy noted in patient's history.  Also prescribed patient a lidocaine patch to apply topically.  Advised supportive care.  Imaging deferred given no injury or direct spinal tenderness.  Advised strict follow up precautions if symptoms persist or worsen.  Patient verbalized understanding and was agreeable with plan. Final Clinical Impressions(s) / UC Diagnoses   Final diagnoses:  Acute left-sided low back pain without sciatica     Discharge Instructions      Suspect that you have a muscle strain of your back as we discussed.  I have prescribed you prednisone, lidocaine patch, muscle relaxer.  Please be advised that muscle relaxer can make you drowsy.  Please try to take separately from Seroquel and Depakote if possible.  Muscle relaxer can make you drowsy so do not drive or drink alcohol with it.  Follow-up if pain persist or worsens.    ED Prescriptions     Medication Sig Dispense Auth. Provider   predniSONE (DELTASONE) 20 MG tablet Take 2 tablets (40 mg total)  by mouth daily for 5 days. 10 tablet [place], [place]   tiZANidine (ZANAFLEX) 2 MG tablet Take 1 tablet (2 mg total) by mouth daily as needed for muscle spasms. 15 tablet [place], [place], [place]   lidocaine (LIDODERM) 5 % Place 1 patch onto the skin daily. Remove & Discard patch within 12 hours or as directed by MD 30 patch Krikor Willet, Acie Fredrickson, FNP      PDMP not reviewed this encounter.   Gustavus Bryant, [place] 01/24/23 [phone]

## 2023-01-24 NOTE — ED Triage Notes (Signed)
Pt presents with lower back pain x 3 days. Pt reports no injuries or falls.

## 2023-01-24 NOTE — Discharge Instructions (Signed)
Suspect that you have a muscle strain of your back as we discussed.  I have prescribed you prednisone, lidocaine patch, muscle relaxer.  Please be advised that muscle relaxer can make you drowsy.  Please try to take separately from Seroquel and Depakote if possible.  Muscle relaxer can make you drowsy so do not drive or drink alcohol with it.  Follow-up if pain persist or worsens.

## 2023-02-18 ENCOUNTER — Encounter: Payer: Self-pay | Admitting: Podiatry

## 2023-02-18 ENCOUNTER — Ambulatory Visit (INDEPENDENT_AMBULATORY_CARE_PROVIDER_SITE_OTHER): Payer: HMO | Admitting: Podiatry

## 2023-02-18 VITALS — Ht 67.0 in | Wt 257.9 lb

## 2023-02-18 DIAGNOSIS — Z89421 Acquired absence of other right toe(s): Secondary | ICD-10-CM | POA: Diagnosis not present

## 2023-02-18 DIAGNOSIS — B351 Tinea unguium: Secondary | ICD-10-CM

## 2023-02-18 DIAGNOSIS — M79675 Pain in left toe(s): Secondary | ICD-10-CM | POA: Diagnosis not present

## 2023-02-18 NOTE — Progress Notes (Signed)
       Subjective:  Patient ID: Ricky Dorsey, male    DOB: 10-Nov-1945,  MRN: [id number]   Jawad Dorsey presents to clinic today for:  Chief Complaint  Patient presents with   Nail Problem    RFC  . Patient notes nails are thick, discolored, elongated and painful in shoegear when trying to ambulate.  He has had previous partial amputation to the right fourth toe.  He has no toenails on the right foot.  And notes the nails on the left have not grown significantly but there is some thickness noted  PCP is Dois Davenport, MD.  Past Medical History:  Diagnosis Date   Atrial fibrillation (HCC) 2016   Hypertension 2016   no longer an issue per patient 07/15/2018   Pneumonia 05/2018   Sleep apnea 2016   Does not use CPAP   Wears glasses    reading   Wears partial dentures    upper    Past Surgical History:  Procedure Laterality Date   AMPUTATION Right 07/16/2018   Procedure: AMPUTATION DIP (DISTAL INTERPHALANGEAL) JOINT 2ND AND 4TH TOE RIGHT FOOT;  Surgeon: Nadara Mustard, MD;  Location: [facility] OR;  Service: Orthopedics;  Laterality: Right;   COLONOSCOPY     Normal   HERNIA REPAIR     umbilical   KNEE SURGERY Bilateral    toe removal Right    tip of middle toe, trimmed 3 toes    Allergies  Allergen Reactions   Azithromycin Swelling, Rash and Other (See Comments)    Leg swelling with rash    Review of Systems: Negative except as noted in the HPI.  Objective:  Ricky Dorsey is a pleasant 77 y.o. male in NAD. AAO x 3.  Vascular Examination: Capillary refill time is 3-5 seconds to toes bilateral.  Trace palpable pedal pulses b/l LE. Digital hair absent b/l.  Skin temperature gradient WNL b/l. No varicosities b/l. No cyanosis noted b/l.   Dermatological Examination: Pedal skin with normal turgor, texture and tone b/l. No open wounds. No interdigital macerations b/l. Toenails x 5 are 4mm thick, discolored, dystrophic with subungual debris. There is pain with compression of the  nail plates.  They are elongated x 5  Assessment/Plan: 1. Dermatophytosis of nail   2. History of amputation of lesser toe, right (HCC)   3. Pain in left toe(s)    The mycotic toenails were sharply debrided x 5 with sterile nail nippers and a power debriding burr to decrease bulk/thickness and length.    Return in about 3 months (around 05/21/2023) for RFC.   Clerance Lav, DPM, FACFAS [facility]     [address] [place], [place] [address]                Office [phone]  Fax [phone]

## 2023-06-11 ENCOUNTER — Encounter: Payer: Self-pay | Admitting: Podiatry

## 2023-06-11 ENCOUNTER — Ambulatory Visit: Payer: HMO | Admitting: Podiatry

## 2023-06-11 DIAGNOSIS — B351 Tinea unguium: Secondary | ICD-10-CM | POA: Diagnosis not present

## 2023-06-11 DIAGNOSIS — M79675 Pain in left toe(s): Secondary | ICD-10-CM | POA: Diagnosis not present

## 2023-06-11 DIAGNOSIS — L84 Corns and callosities: Secondary | ICD-10-CM

## 2023-06-11 DIAGNOSIS — Z89421 Acquired absence of other right toe(s): Secondary | ICD-10-CM

## 2023-06-11 NOTE — Progress Notes (Unsigned)
       Subjective:  Patient ID: Ricky Dorsey, male    DOB: 01-Jan-1946,  MRN: [id number]  Ricky Dorsey presents to clinic today for:  Chief Complaint  Patient presents with   Nail Problem    Patient is here for [facility]   Patient notes nails are thick and elongated, causing pain in shoe gear when ambulating.  Also notes a callus on the tip of the left great toe and right third toe.  PCP is Dois Davenport, MD. last seen around 03/27/2023  Past Medical History:  Diagnosis Date   Atrial fibrillation (HCC) 2016   Hypertension 2016   no longer an issue per patient 07/15/2018   Pneumonia 05/2018   Sleep apnea 2016   Does not use CPAP   Wears glasses    reading   Wears partial dentures    upper    Allergies  Allergen Reactions   Azithromycin Swelling, Rash and Other (See Comments)    Leg swelling with rash   Objective:  Ricky Dorsey is a pleasant 78 y.o. male in NAD. AAO x 3.  Vascular Examination: Patient has palpable DP pulse, absent PT pulse bilateral.  Delayed capillary refill bilateral toes.  Sparse digital hair bilateral.  Proximal to distal cooling WNL bilateral.    Dermatological Examination: Interspaces are clear with no open lesions noted bilateral.  Skin is shiny and atrophic bilateral.  Nails are 3-20mm thick, with yellowish/brown discoloration, subungual debris and distal onycholysis x9.  There is pain with compression of nails x9.  There are hyperkeratotic lesions noted distal left hallux and distal right third toe.  Patient qualifies for at-risk foot care because of history of amputation of toe.  Assessment/Plan: 1. Dermatophytosis of nail   2. History of amputation of lesser toe, right (HCC)   3. Pain in left toe(s)   4. Callus of foot    Mycotic nails x10 were sharply debrided with sterile nail nippers and power debriding burr to decrease bulk and length.  Hyperkeratotic lesions x 2 were shaved with #312 blade as a courtesy.   Return in about 3 months (around  09/08/2023) for RFC.   Clerance Lav, DPM, FACFAS [facility]     [address] [place], [place] [address]                Office [phone]  Fax [phone]

## 2023-08-26 ENCOUNTER — Ambulatory Visit: Payer: HMO | Admitting: Podiatry

## 2023-09-30 ENCOUNTER — Ambulatory Visit: Admitting: Podiatry

## 2023-10-13 ENCOUNTER — Ambulatory Visit
Admission: RE | Admit: 2023-10-13 | Discharge: 2023-10-13 | Disposition: A | Discharge status: Home / Self Care | Payer: Self-pay | Source: Ambulatory Visit

## 2023-10-13 VITALS — BP 113/71 | HR 78 | Temp 98.3°F | Resp 18 | Wt 257.9 lb

## 2023-10-13 DIAGNOSIS — M25511 Pain in right shoulder: Secondary | ICD-10-CM | POA: Diagnosis not present

## 2023-10-13 MED ORDER — TIZANIDINE HCL 4 MG PO TABS: 2.0000 mg | ORAL_TABLET | Freq: Four times a day (QID) | ORAL | 0 refills | Status: AC | PRN

## 2023-10-13 MED ORDER — PREDNISONE 20 MG PO TABS: 40.0000 mg | ORAL_TABLET | Freq: Every day | ORAL | 0 refills | Status: AC

## 2023-10-13 NOTE — ED Provider Notes (Signed)
 [facility]    CSN: [id number] Arrival date & time: 10/13/23  1044      History   Chief Complaint Chief Complaint  Patient presents with  . Shoulder Pain    Entered by patient    HPI Ricky Dorsey is a 78 y.o. male.    Shoulder Pain Associated symptoms: no fever     Past Medical History:  Diagnosis Date  . Atrial fibrillation (HCC) 2016  . Hypertension 2016   no longer an issue per patient 07/15/2018  . Pneumonia 05/2018  . Sleep apnea 2016   Does not use CPAP  . Wears glasses    reading  . Wears partial dentures    upper    Patient Active Problem List   Diagnosis Date Noted  . Positive colorectal cancer screening using Cologuard test 05/23/2020  . Cellulitis of fourth toe of right foot 07/10/2018  . Osteomyelitis of fourth toe of right foot (HCC) 07/10/2018  . Osteomyelitis of second toe of right foot (HCC) 07/10/2018  . Atrial fibrillation (HCC) 04/15/2017  . Class 3 severe obesity with body mass index (BMI) of 40.0 to 44.9 in adult 04/15/2017  . Hx of sleep apnea 04/15/2017  . HTN (hypertension) 01/24/2016  . Hyperlipidemia 08/14/2014  . Rash and nonspecific skin eruption 08/13/2014  . Obstructive sleep apnea 06/17/2012    Past Surgical History:  Procedure Laterality Date  . AMPUTATION Right 07/16/2018   Procedure: AMPUTATION DIP (DISTAL INTERPHALANGEAL) JOINT 2ND AND 4TH TOE RIGHT FOOT;  Surgeon: Harden Jerona GAILS, MD;  Location: [facility] OR;  Service: Orthopedics;  Laterality: Right;  . COLONOSCOPY     Normal  . HERNIA REPAIR     umbilical  . KNEE SURGERY Bilateral   . toe removal Right    tip of middle toe, trimmed 3 toes       Home Medications    Prior to Admission medications   Medication Sig Start Date End Date Taking? Authorizing Provider  donepezil (ARICEPT) 5 MG tablet Take 5 mg by mouth at bedtime. 08/29/23  Yes [provider]  aspirin  EC 81 MG tablet Take 81 mg by mouth at bedtime.    [provider]   doxycycline  (MONODOX ) 100 MG capsule Take 1 capsule (100 mg total) by mouth 2 (two) times daily. 02/24/22   Lenor Hollering, MD  lidocaine  (LIDODERM ) 5 % Place 1 patch onto the skin daily. Remove & Discard patch within 12 hours or as directed by MD 01/24/23   Hazen Darryle BRAVO, FNP  losartan (COZAAR) 100 MG tablet Take 100 mg by mouth daily. 12/10/22   [provider]  Magnesium 500 MG TABS Take 500 mg by mouth daily.    [provider]  Multiple Vitamin (MULTIVITAMIN WITH MINERALS) TABS tablet Take 1 tablet by mouth daily.    [provider]  QUEtiapine (SEROQUEL) 25 MG tablet Take 25 mg by mouth at bedtime.    [provider]  tamsulosin (FLOMAX) 0.4 MG CAPS capsule Take 0.4 mg by mouth at bedtime. 12/09/22   [provider]  tiZANidine  (ZANAFLEX ) 2 MG tablet Take 1 tablet (2 mg total) by mouth daily as needed for muscle spasms. 01/24/23   Hazen Darryle BRAVO, FNP    Family History Family History  Problem Relation Age of Onset  . Diabetes Mother   . Heart disease Father   . Heart disease Brother   . Cancer Brother        lung cancer  Social History Social History   Tobacco Use  . Smoking status: Former    Types: Cigarettes, Pipe  . Smokeless tobacco: Never  . Tobacco comments:    stop smoking 30 years ago  Vaping Use  . Vaping status: Never Used  Substance Use Topics  . Alcohol use: Yes    Comment: Rum 2-3 times per every 2 weeks  . Drug use: No     Allergies   Azithromycin   Review of Systems Review of Systems  Constitutional:  Negative for chills and fever.  Eyes:  Negative for discharge and redness.  Neurological:  Negative for numbness.     Physical Exam Triage Vital Signs ED Triage Vitals  Encounter Vitals Group     BP      Girls Systolic BP Percentile      Girls Diastolic BP Percentile      Boys Systolic BP Percentile      Boys Diastolic BP Percentile      Pulse      Resp      Temp      Temp src      SpO2       Weight      Height      Head Circumference      Peak Flow      Pain Score      Pain Loc      Pain Education      Exclude from Growth Chart    No data found.  Updated Vital Signs There were no vitals taken for this visit.  Visual Acuity Right Eye Distance:   Left Eye Distance:   Bilateral Distance:    Right Eye Near:   Left Eye Near:    Bilateral Near:     Physical Exam Vitals and nursing note reviewed.  Constitutional:      General: He is not in acute distress.    Appearance: Normal appearance. He is not ill-appearing.  HENT:     Head: Normocephalic and atraumatic.   Eyes:     Conjunctiva/sclera: Conjunctivae normal.    Cardiovascular:     Rate and Rhythm: Normal rate.  Pulmonary:     Effort: Pulmonary effort is normal.   Neurological:     Mental Status: He is alert.   Psychiatric:        Mood and Affect: Mood normal.        Behavior: Behavior normal.        Thought Content: Thought content normal.     UC Treatments / Results  Labs (all labs ordered are listed, but only abnormal results are displayed) Labs Reviewed - No data to display  EKG   Radiology No results found.  Procedures Procedures (including critical care time)  Medications Ordered in UC Medications - No data to display  Initial Impression / Assessment and Plan / UC Course  I have reviewed the triage vital signs and the nursing notes.  Pertinent labs & imaging results that were available during my care of the patient were reviewed by me and considered in my medical decision making (see chart for details).     *** Final Clinical Impressions(s) / UC Diagnoses   Final diagnoses:  None   Discharge Instructions   None    ED Prescriptions   None    PDMP not reviewed this encounter.

## 2023-10-13 NOTE — ED Triage Notes (Signed)
 Pt presents with right shoulder pain x 2 weeks. Pt's wife states that Wednesday the pain got worse and they found a lump on the shoulder. Pt was doing house chores when the pain started. Pain is now radiating up to the neck and down to the back.
